# Patient Record
Sex: Male | Born: 1950 | Race: Black or African American | Hispanic: No | Marital: Married | Smoking: Never smoker
Health system: Southern US, Community
[De-identification: ages and names within clinical notes are randomized; demographics above are authoritative.]

## PROBLEM LIST (undated history)

## (undated) DIAGNOSIS — I2699 Other pulmonary embolism without acute cor pulmonale: Secondary | ICD-10-CM

## (undated) DIAGNOSIS — M1990 Unspecified osteoarthritis, unspecified site: Secondary | ICD-10-CM

## (undated) DIAGNOSIS — E785 Hyperlipidemia, unspecified: Secondary | ICD-10-CM

## (undated) HISTORY — DX: Hyperlipidemia, unspecified: E78.5

## (undated) HISTORY — PX: COLONOSCOPY: SHX174

## (undated) HISTORY — DX: Other pulmonary embolism without acute cor pulmonale: I26.99

## (undated) HISTORY — PX: UPPER GASTROINTESTINAL ENDOSCOPY: SHX188

## (undated) HISTORY — PX: ACHILLES TENDON REPAIR: SUR1153

## (undated) HISTORY — DX: Unspecified osteoarthritis, unspecified site: M19.90

---

## 1999-11-22 ENCOUNTER — Ambulatory Visit (HOSPITAL_BASED_OUTPATIENT_CLINIC_OR_DEPARTMENT_OTHER): Admission: RE | Admit: 1999-11-22 | Discharge: 1999-11-22 | Payer: Self-pay | Admitting: Ophthalmology

## 2002-12-27 ENCOUNTER — Ambulatory Visit (HOSPITAL_COMMUNITY): Admission: RE | Admit: 2002-12-27 | Discharge: 2002-12-27 | Payer: Self-pay | Admitting: Internal Medicine

## 2002-12-27 ENCOUNTER — Encounter: Payer: Self-pay | Admitting: Internal Medicine

## 2005-12-15 ENCOUNTER — Ambulatory Visit: Payer: Self-pay | Admitting: Internal Medicine

## 2005-12-16 ENCOUNTER — Ambulatory Visit: Payer: Self-pay | Admitting: Internal Medicine

## 2006-03-17 ENCOUNTER — Ambulatory Visit: Payer: Self-pay | Admitting: Endocrinology

## 2006-12-14 ENCOUNTER — Ambulatory Visit: Payer: Self-pay | Admitting: Internal Medicine

## 2006-12-14 LAB — CONVERTED CEMR LAB
ALT: 16 units/L (ref 0–40)
AST: 30 units/L (ref 0–37)
Albumin: 3.7 g/dL (ref 3.5–5.2)
Alkaline Phosphatase: 60 units/L (ref 39–117)
BUN: 16 mg/dL (ref 6–23)
Bacteria, U Microscopic: NEGATIVE /hpf
Basophils Absolute: 0 10*3/uL (ref 0.0–0.1)
Basophils Relative: 0.4 % (ref 0.0–1.0)
Bilirubin Urine: NEGATIVE
CO2: 30 meq/L (ref 19–32)
Calcium: 9.5 mg/dL (ref 8.4–10.5)
Chloride: 106 meq/L (ref 96–112)
Chol/HDL Ratio, serum: 2.7
Cholesterol: 151 mg/dL (ref 0–200)
Creatinine, Ser: 1.3 mg/dL (ref 0.4–1.5)
Crystals: NEGATIVE
Eosinophil percent: 3.2 % (ref 0.0–5.0)
GFR calc non Af Amer: 61 mL/min
Glomerular Filtration Rate, Af Am: 74 mL/min/{1.73_m2}
Glucose, Bld: 82 mg/dL (ref 70–99)
HCT: 43.1 % (ref 39.0–52.0)
HDL: 55 mg/dL (ref >39.0)
Hemoglobin: 14.4 g/dL (ref 13.0–17.0)
Ketones, ur: NEGATIVE mg/dL
LDL Cholesterol: 87 mg/dL (ref 0–99)
Leukocytes, UA: NEGATIVE
Lymphocytes Relative: 38.9 % (ref 12.0–46.0)
MCHC: 33.4 g/dL (ref 30.0–36.0)
MCV: 91.2 fL (ref 78.0–100.0)
Monocytes Absolute: 0.6 10*3/uL (ref 0.2–0.7)
Monocytes Relative: 10 % (ref 3.0–11.0)
Mucus, UA: NEGATIVE
Neutro Abs: 2.9 10*3/uL (ref 1.4–7.7)
Neutrophils Relative %: 47.5 % (ref 43.0–77.0)
Nitrite: NEGATIVE
PSA: 1.05 ng/mL (ref 0.10–4.00)
Platelets: 264 10*3/uL (ref 150–400)
Potassium: 4.2 meq/L (ref 3.5–5.1)
RBC: 4.72 M/uL (ref 4.22–5.81)
RDW: 13.6 % (ref 11.5–14.6)
Sodium: 140 meq/L (ref 135–145)
Specific Gravity, Urine: 1.025 (ref 1.000–1.03)
TSH: 1.67 microintl units/mL (ref 0.35–5.50)
Total Bilirubin: 0.9 mg/dL (ref 0.3–1.2)
Total Protein, Urine: NEGATIVE mg/dL
Total Protein: 6.9 g/dL (ref 6.0–8.3)
Triglyceride fasting, serum: 43 mg/dL (ref 0–149)
Urine Glucose: NEGATIVE mg/dL
Urobilinogen, UA: 0.2 (ref 0.0–1.0)
VLDL: 9 mg/dL (ref 0–40)
WBC: 6.1 10*3/uL (ref 4.5–10.5)
pH: 6 (ref 5.0–8.0)

## 2006-12-18 ENCOUNTER — Ambulatory Visit: Payer: Self-pay | Admitting: Internal Medicine

## 2007-07-02 DIAGNOSIS — I2699 Other pulmonary embolism without acute cor pulmonale: Secondary | ICD-10-CM

## 2007-07-02 HISTORY — DX: Other pulmonary embolism without acute cor pulmonale: I26.99

## 2007-07-05 ENCOUNTER — Ambulatory Visit: Payer: Self-pay | Admitting: Internal Medicine

## 2007-07-05 ENCOUNTER — Ambulatory Visit: Payer: Self-pay | Admitting: Cardiology

## 2007-07-05 ENCOUNTER — Inpatient Hospital Stay (HOSPITAL_COMMUNITY): Admission: AD | Admit: 2007-07-05 | Discharge: 2007-07-09 | Payer: Self-pay | Admitting: Internal Medicine

## 2007-07-05 ENCOUNTER — Ambulatory Visit: Payer: Self-pay | Admitting: Critical Care Medicine

## 2007-07-06 DIAGNOSIS — I82409 Acute embolism and thrombosis of unspecified deep veins of unspecified lower extremity: Secondary | ICD-10-CM

## 2007-07-06 DIAGNOSIS — I2699 Other pulmonary embolism without acute cor pulmonale: Secondary | ICD-10-CM

## 2007-07-07 ENCOUNTER — Ambulatory Visit: Payer: Self-pay | Admitting: Vascular Surgery

## 2007-07-12 ENCOUNTER — Ambulatory Visit: Payer: Self-pay | Admitting: Internal Medicine

## 2007-07-12 LAB — CONVERTED CEMR LAB
INR: 1.4 (ref 0.9–2.0)
Prothrombin Time: 14.4 s — ABNORMAL HIGH (ref 10.0–14.0)

## 2007-07-15 ENCOUNTER — Ambulatory Visit: Payer: Self-pay | Admitting: Cardiovascular Disease

## 2007-07-15 LAB — CONVERTED CEMR LAB
INR: 2.2 — ABNORMAL HIGH (ref 0.9–2.0)
Prothrombin Time: 18.4 s — ABNORMAL HIGH (ref 10.0–14.0)

## 2007-07-19 ENCOUNTER — Ambulatory Visit: Payer: Self-pay | Admitting: Internal Medicine

## 2007-07-19 ENCOUNTER — Ambulatory Visit: Payer: Self-pay | Admitting: Cardiology

## 2007-07-29 ENCOUNTER — Ambulatory Visit: Payer: Self-pay | Admitting: Cardiology

## 2007-08-12 ENCOUNTER — Ambulatory Visit: Payer: Self-pay | Admitting: Cardiology

## 2007-09-06 ENCOUNTER — Ambulatory Visit: Payer: Self-pay | Admitting: Critical Care Medicine

## 2007-09-09 ENCOUNTER — Ambulatory Visit: Payer: Self-pay | Admitting: Cardiology

## 2007-09-30 ENCOUNTER — Ambulatory Visit: Payer: Self-pay | Admitting: Internal Medicine

## 2007-11-02 ENCOUNTER — Telehealth: Payer: Self-pay | Admitting: Internal Medicine

## 2007-11-09 ENCOUNTER — Ambulatory Visit: Payer: Self-pay | Admitting: Cardiology

## 2007-11-22 ENCOUNTER — Ambulatory Visit: Payer: Self-pay | Admitting: Cardiology

## 2007-12-10 ENCOUNTER — Ambulatory Visit: Payer: Self-pay | Admitting: Cardiology

## 2008-10-24 IMAGING — CT CT ANGIO CHEST
4 of 7 series · 12 of 30 positions shown · IV contrast (100 ML OMNI 300)
Comparison: None

CLINICAL DATA: Shortness of breath on exertion for 1-1/2 weeks. Hypertension.

CT ANGIOGRAPHY OF CHEST - PULMONARY EMBOLISM PROTOCOL
TECHNIQUE: Multidetector CT imaging of the chest was performed according to the
protocol for detection of pulmonary embolism during bolus injection of
intravenous contrast.  Coronal and sagittal plane CT angiographic image
reconstructions were also generated.
Contrast:  100 cc Omnipaque 300

[Series 3: pe · axial · 0.66mm/px · z∈[-245,-68]mm · 5 of 235 slices shown]
[im 47/235  lung]
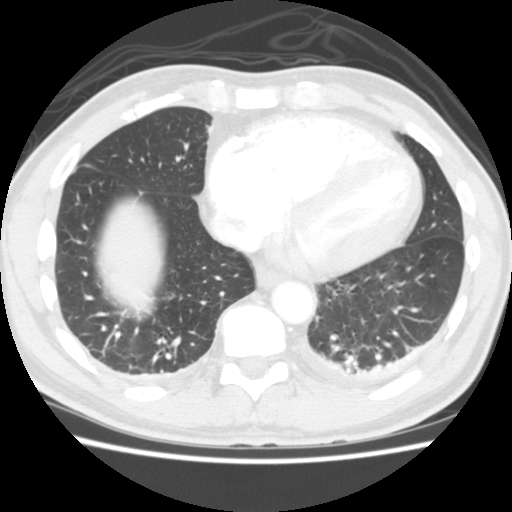
[im 94/235  mediastinal]
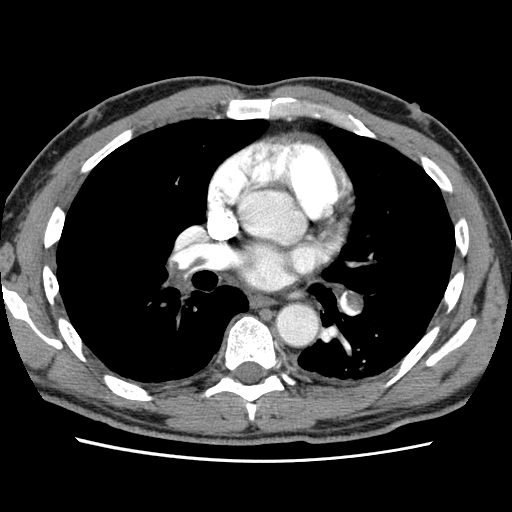
[im 118/235  lung]
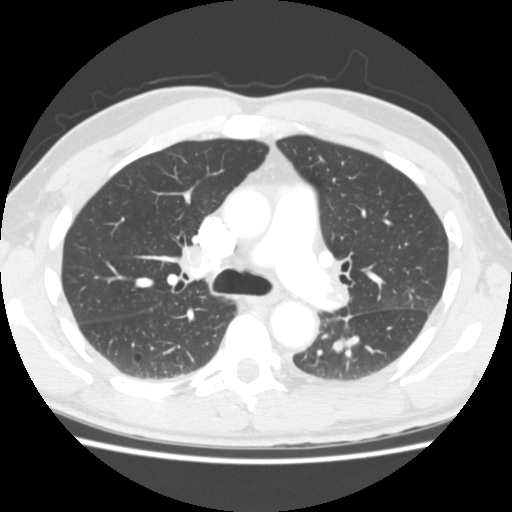
[im 141/235  mediastinal]
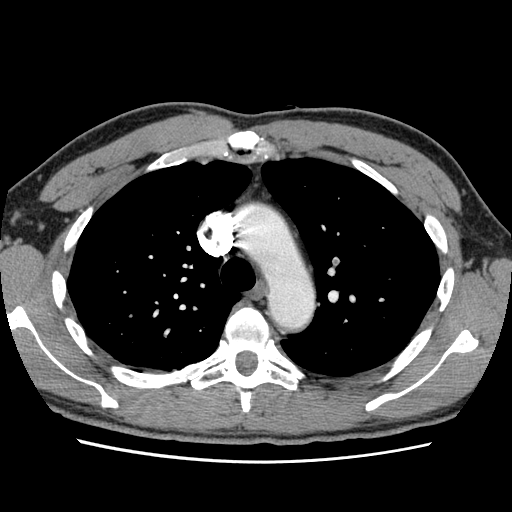
[im 188/235  lung]
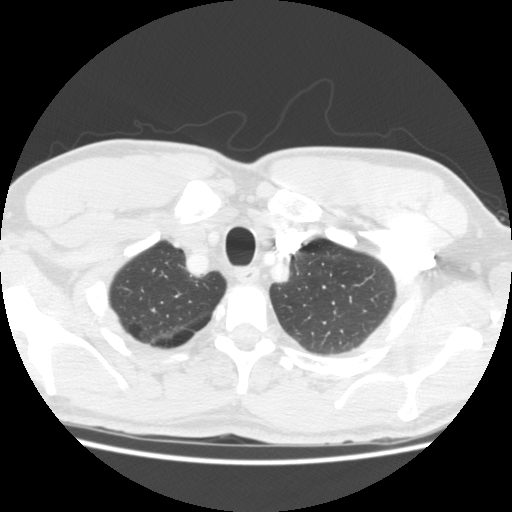

[Series 4: recon 2: pe · axial · 0.66mm/px · z∈[-157,-155]mm · 2 of 118 slices shown]
[im 59/118  lung]
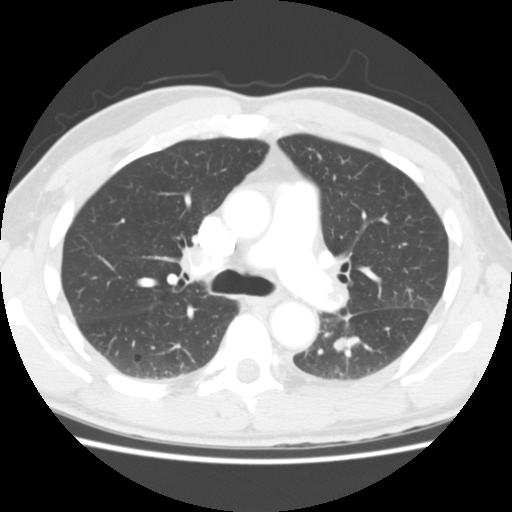
[im 60/118  lung]
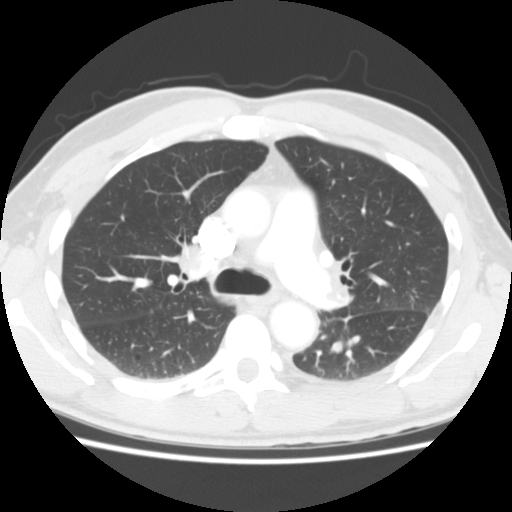

[Series 300: sag pe · sagittal · 0.64mm/px · 3 of 145 slices shown]
[im 37/145  lung]
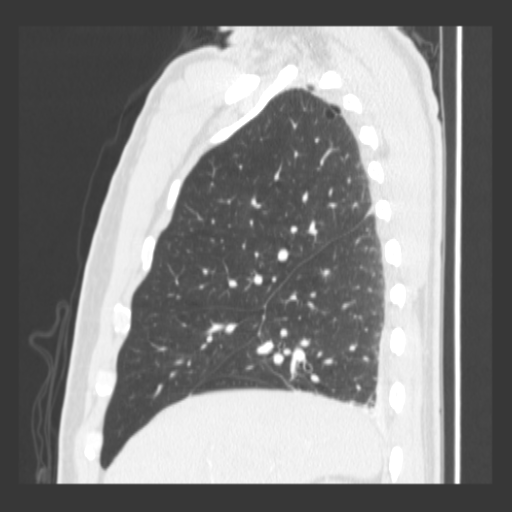
[im 73/145  lung]
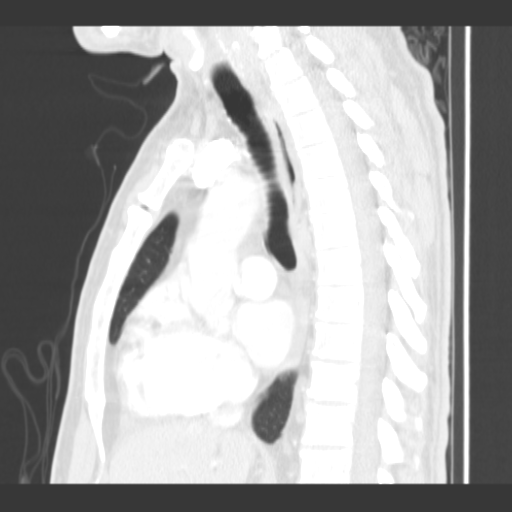
[im 109/145  lung]
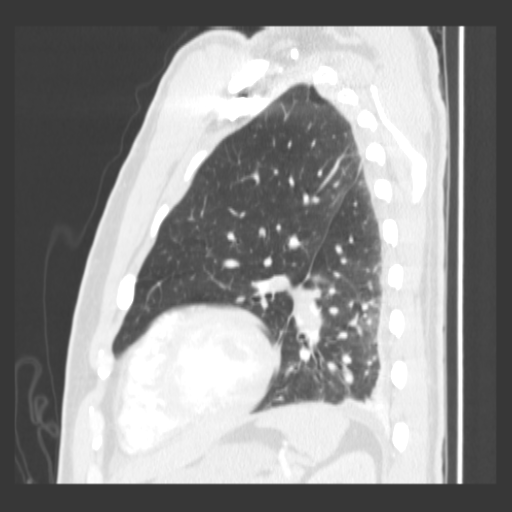

[Series 301: cor pe · coronal · 0.64mm/px · 2 of 117 slices shown]
[im 39/117  lung]
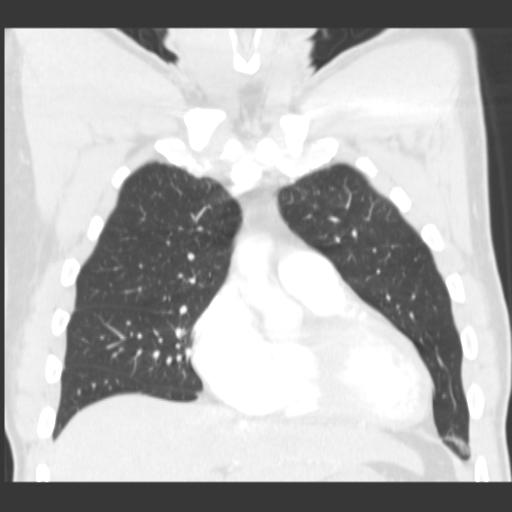
[im 78/117  lung]
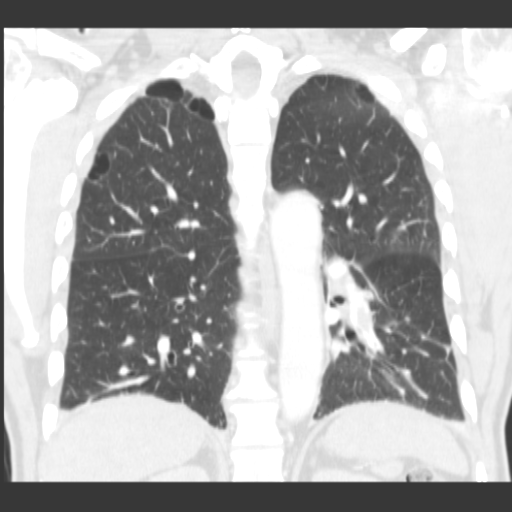

[12 of 30 positions shown; findings below may reference images not displayed]

FINDINGS: Lung windows demonstrate paraseptal emphysema. Bibasilar atelectasis.

Soft tissue windows:  The quality of this exam for evaluation of pulmonary
embolism is sufficient. Extensive bilateral pulmonary emboli with extension into
all lobar pulmonary artery branches.

There is flattening of the interventricular septum suspicious for mild right
heart strain. The pulmonary outflow tract measures within normal limits.

Heart is mildly enlarged. Trace bilateral pleural effusions.

Normal aortic caliber without evidence of a dissection. No mediastinal or hilar
adenopathy.

Limited abdominal imaging is unremarkable.

Bone windows demonstrate no worrisome osseous lesion. .

IMPRESSION

1. Extensive bilateral pulmonary embolism with findings suspicious for right
heart strain.  I called this report to, the patient's nurse, Rantona, at [DATE] a.m.
on 07/07/2007. 

2. Mild cardiomegaly. Trace bilateral pleural effusions.

## 2011-04-15 NOTE — Cardiovascular Report (Signed)
NAME:  ORIEL, RUMBOLD NO.:  [id number]   MEDICAL RECORD NO.:  [id number]          PATIENT TYPE:  INP   LOCATION:  4705                         FACILITY:  [facility]   PHYSICIAN:  Veverly Fells. Excell Seltzer, MD  DATE OF BIRTH:  1951-11-25   DATE OF PROCEDURE:  07/06/2007  DATE OF DISCHARGE:                            CARDIAC CATHETERIZATION   PROCEDURE:  1. Left heart catheterization.  2. Selective coronary angiography.  3. Left ventricular angiography.  4. StarClose of the right femoral artery.   INDICATIONS:  Jorge Rogers is a 60 year old gentleman who presented with  chest pain.  He ruled in for a small subendocardial MI.  In the setting  of his chest pain with positive cardiac enzymes, he was referred for  cardiac catheterization.  He also had shortness of breath at  presentation and a positive D-dimer, and plans are underway to evaluate  his coronary status with a left heart catheterization and, if that is  unremarkable, to proceed with further testing for pulmonary embolus.   Risks and indications of procedure were reviewed with the patient.  Informed consent was obtained.  The right groin was prepped, draped and  anesthetized with 1% lidocaine. Using modified the Seldinger technique,  a 6-French sheath was placed in the right femoral artery.  Multiple  views of the right and left coronary arteries were taken with standard  preformed Judkins catheters.  Following selective coronary angiography,  an angled pigtail was inserted into the left ventricle, and pressures  were recorded.  Left ventriculogram was performed. A pullback across the  aortic valve was done. At the completion of the procedure, a StarClose  device was used to seal the arteriotomy.  There were no immediate  complications.   FINDINGS:  Aortic pressure 89/64 with a mean of 77, left ventricular  pressure 89/6.   The left mainstem is angiographically normal.  It bifurcates into the  LAD and left  circumflex   The LAD is a large-caliber vessel that courses down and wraps around the  LV apex. The ostium of the LAD has minor tapering which may be a  nonobstructive plaque or also could represent a normal anatomic variant.  It  appears no worse than a 20% stenosis.  The remaining portions of the  proximal, mid, and distal LAD are free of any significant angiographic  disease.  There are three diagonal branches, all of which are relatively  small.   Left circumflex is a large-caliber vessel.  It courses down and supplies  a large first OM branch that gives off multiple OM branch vessels.  The  AV groove circumflex is medium size and courses down to give a left  posterolateral branch.  There is no significant angiographic disease in  the left circumflex.   The right coronary artery is dominant.  There is a PDA branch as well as  a posterior AV segment that supplies two small posterolaterals. There is  a large acute marginal branch.  There is no significant angiographic  disease in the right coronary artery.   Left ventricular function is low-normal.  LVEF is estimated 50-55%.  There is no mitral regurgitation.   ASSESSMENT:  1. Normal right coronary artery.  2. Normal left circumflex.  3. Minor nonobstructive left anterior descending stenosis.  4. Normal left ventricular function.   DISCUSSION:  Mr. Wernick likely has noncardiac chest pain.   PLAN:  As noted, rule out pulmonary embolus, and I agree with this plan  in a patient with chest pain and positive D-dimer. No other cardiac  workup is necessary.      Veverly Fells. Excell Seltzer, MD  Electronically Signed     MDC/MEDQ  D:  07/06/2007  T:  07/06/2007  Job:  [id number]   cc:   Georgina Quint. Plotnikov, MD

## 2011-04-15 NOTE — H&P (Signed)
NAME:  Jorge Rogers, Jorge Rogers NO.:  [id number]   MEDICAL RECORD NO.:  [id number]          PATIENT TYPE:  INP   LOCATION:  4705                         FACILITY:  [facility]   PHYSICIAN:  Georgina Quint. Plotnikov, MDDATE OF BIRTH:  [birth year]   DATE OF ADMISSION:  07/05/2007  DATE OF DISCHARGE:                              HISTORY & PHYSICAL   CHIEF COMPLAINT:  Shortness of breath, chest tightness.   HISTORY OF PRESENT ILLNESS:  The patient is a 60 year old male, an avid  athlete, who was playing tennis on Saturday a week ago when he noticed  that he has chest tightness and shortness of breath.  He was sweating a  lot.  He was able to finish the game but was very tired.  He has played  probably 2 or 3 times since then and every time felt short of breath,  was sweating profusely and felt tired.  He also played golf and again  was quite short of breath.  There was some tightness the back of his  legs.  Right now he thinks he might be having some tightness in the  chest; however, it is extremely mild.  On average, when he is not  playing sports he has remained symptom-free.  No previous exertional  problems.  No recent travel or periods of immobility.   FAMILY HISTORY:  Mother is in her 16s.  She is alive.  Father died in  his 7s with probably coronary disease and heart attack.   He does not smoke, does not drink alcohol.  He placed golf and tennis  quite a bit.   SOCIAL HISTORY:  He is married.  He does not smoke or drink alcohol.  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   PAST MEDICAL HISTORY:  GERD.  No history of coronary disease or high  blood pressure.   REVIEW OF SYSTEMS:  As above.  No indigestion, no blood in the stool.  The rest of the 18-point review of systems is negative.   PHYSICAL:  Blood pressure 109/73, pulse 78, temperature 99, weight 200  pounds.  Looks well, in no acute distress.  HEENT: Moist mucosa.  NECK:  Supple.  No thyromegaly or bruit.  LUNGS:  Clear.  No wheezes or rales.  HEART:  S1-S2, no murmur, no gallop.  ABDOMEN:  Soft, nontender, no organomegaly, no mass felt.  LOWER EXTREMITIES:  Without edema.  Calves nontender.  No swelling.  Good peripheral pulses.  He is alert, oriented and cooperative.  Denies being depressed.  ABDOMEN:  Without pulsating masses.   EKG with ST changes, new from the previous.  O2 saturation 98% on room  air.   ASSESSMENT/PLAN:  1. Exertional chest pain of 1+ week duration.  Dyspnea on exertion.      In view of his new EKG changes, will admit to [facility],      obtain cardiology consult, obtain CT of the chest to rule out      pulmonary embolus protocol, obtain fibrinogen and D-dimer, start on      aspirin, nitroglycerin,  Lovenox if CT is negative for aortic      aneurysm, rule out MI protocol with EKG in the morning, CK,      troponin q.8h.  2. Family history of coronary disease.  Plan as above.  3. Gastroesophageal reflux disease.  He is on omeprazole 20 mg daily,      well-controlled.      Georgina Quint. Plotnikov, MD  Electronically Signed     AVP/MEDQ  D:  07/05/2007  T:  07/06/2007  Job:  [id number]

## 2011-04-15 NOTE — Consult Note (Signed)
NAME:  Jorge Rogers, Jorge Rogers NO.:  [id number]   MEDICAL RECORD NO.:  [id number]          PATIENT TYPE:  INP   LOCATION:  4705                         FACILITY:  [facility]   PHYSICIAN:  Charlcie Cradle. Delford Field, MD, FCCPDATE OF BIRTH:  08-Jan-1951   DATE OF CONSULTATION:  DATE OF DISCHARGE:                                 CONSULTATION   PULMONARY CONSULTATION:   CHIEF COMPLAINT:  Pulmonary embolism.   HISTORY OF PRESENT ILLNESS:  This 60 year old male who while playing  tennis developed increased chest tightness and shortness of breath a  week ago.  Prior to this, he had noticed some calf pain on the right.  He has played it several times since that time and felt more short of  breath and had profuse diaphoresis.  He also played golf and was short  of breath with this as well, had some fatigue in the back of his legs.  He was admitted on July 05, 2007.  Initially had positive D-dimer and  low-grade positive enzymes.  It was felt that he might have a  subendocardial MI.  He was given IV heparin, taken to cath lab on July 06, 2007, and it showed no coronary artery disease.  CAT scan of the  chest today showed bilateral pulmonary emboli.  He is referred for  further evaluation.   PAST MEDICAL HISTORY:  1. Reflux disease.  2. No previous history of coronary disease or hypertension or the risk      factors.   SOCIAL HISTORY:  He is married.  Does not smoke or drink.  Works as an  Land.   FAMILY HISTORY:  Mother is alive in her 36s.  Father died in his 28s of  coronary artery disease.   MEDICATIONS PRIOR TO ADMISSION:  None.   PHYSICAL EXAM:  VITAL SIGNS:  Temperature 98, blood pressure 101/70,  pulse 91, respirations 16, saturation 98% on room air.  CHEST:  Showed to be clear without evidence of wheeze or rhonchi.  CARDIAC EXAM:  Showed a regular rate and rhythm without S3.  Normal S1,  S2.  ABDOMEN:  Soft, nontender.  EXTREMITIES:  Showed no  edema or clubbing.  SKIN:  Clear.   CT scan of the chest was obtained and reviewed and shows bilateral  pulmonary emboli.  Deep venous studies have not been done of the lower  extremities as of yet.  D-dimer was 8.75, fibrinogen 499.  Creatinine  1.2.  Hemoglobin 12.7, white count 7.6.  Sodium 138, potassium 3,  chloride 106, BUN 14, Creatinine 1.4.   IMPRESSION:  Bilateral pulmonary emboli but no indication for or need  for vena cava filter or lytic therapy.  The patient is hemodynamically  stable.   RECOMMENDATIONS:  Obtain TSH, CA-125, hypercoagulable panel, and will be  given consideration for the __________ trial and also obtain venous  Dopplers of lower extremities.      Charlcie Cradle Delford Field, MD, [facility]  Electronically Signed     PEW/MEDQ  D:  07/07/2007  T:  07/08/2007  Job:  [id number]  cc:   Raenette Rover. Felicity Coyer, MD  Georgina Quint Plotnikov, MD

## 2011-04-15 NOTE — Discharge Summary (Signed)
NAME:  Jorge Rogers, Jorge Rogers NO.:  [id number]   MEDICAL RECORD NO.:  [id number]          PATIENT TYPE:  INP   LOCATION:  4705                         FACILITY:  [facility]   PHYSICIAN:  Jorge Rogers, MDDATE OF BIRTH:  Oct 03, 1951   DATE OF ADMISSION:  07/05/2007  DATE OF DISCHARGE:  07/09/2007                               DISCHARGE SUMMARY   DISCHARGE DIAGNOSES:  1. Bilateral pulmonary embolus.  2. Right lower extremity deep venous thrombosis.  3. Gastroesophageal reflux disease.  4. Chest pain secondary to bilateral pulmonary embolus, status post      cardiac catheterization performed July 06, 2007, which was      negative.   HISTORY OF PRESENT ILLNESS:  Mr. Jorge Rogers is a 60 year old male who was  admitted on July 05, 2007, with chief complaint of shortness of breath  and chest tightness.  He noted that he had been sweating a lot  frequently and had increased fatigue while playing tennis.  This was  accompanied by some tightness in the back of his legs.  He was admitted  for further evaluation and treatment.   PAST MEDICAL HISTORY:  1. GERD.  2. No history of coronary artery disease or high blood pressure.   HOSPITAL COURSE:  The patient was admitted and initially underwent  serial cardiac enzymes which noted a mild elevation of troponin with a  value of 0.08.  D-dimer was elevated on admission with value of 8.75.  He was anticoagulated with IV heparin and a cardiology consult was  requested.  The patient underwent a cardiac catheterization performed by  Dr. Tonny Rogers on July 06, 2007.  This showed normal coronary  arteries with a normal LV function.  It was felt that his chest pain was  noncardiac.  This was followed by CT angiography of the chest which was  positive for bilateral PE.  He was continued on IV heparin and then  transitioned over to full-dose Lovenox.  He was started on Coumadin.  At  this time, the patient is clinically stable.  He was seen  in  consultation by Dr. Shan Rogers of pulmonary during this admission  and has been enrolled in the [facility] trial.  He has been randomized to  Lovenox and Coumadin and plans at this time are for the patient to  follow up in the Coumadin clinic on Monday morning, July 12, 2007,  with full-dose Lovenox.  Lovenox has been obtained with the assistance  of the reimbursement and patient assistance program as the patient is  currently on Ensure.  Room air saturations with ambulation have been  stable.   The patient also underwent lower extremity Dopplers bilaterally which  noted a right-sided DVT.   DISCHARGE MEDICATIONS:  1. Lovenox 90 mg subcutaneous twice daily until further instructions      per Coumadin clinic.  The patient is being provided with 10 doses      from the patient assistance program.  2. Coumadin 5 mg p.o. daily in the evening to be adjusted per Coumadin      clinic.  DISPOSITION:  Plan to discharge the patient to home.   FOLLOW UP:  The patient is instructed to follow up with Dr. Macarthur Critchley  Rogers in 1 week.  In addition, he is to follow up in the Coumadin  Clinic on Monday morning, July 12, 2007, at 9 a.m. for followup  PT/INR.      Jorge Craze, NP      Jorge Quint. Plotnikov, MD  Electronically Signed    MO/MEDQ  D:  07/09/2007  T:  07/09/2007  Job:  [id number]

## 2011-04-15 NOTE — Consult Note (Signed)
NAME:  WAYMOND, MEADOR NO.:  [id number]   MEDICAL RECORD NO.:  [id number]          PATIENT TYPE:  INP   LOCATION:  4705                         FACILITY:  [facility]   PHYSICIAN:  Thomas C. Wall, MD, FACCDATE OF BIRTH:  February 26, 1951   DATE OF CONSULTATION:  07/05/2007  DATE OF DISCHARGE:                                 CONSULTATION   PRIMARY CARE PHYSICIAN:  Dr. Posey Rea   PRIMARY CARDIOLOGIST:  New - will be Dr. Daleen Squibb.   CHIEF COMPLAINT:  Chest pain/abnormal ECG.   HISTORY OF PRESENT ILLNESS:  Mr. Gueye is a 60 year old African American  male with no history of coronary artery disease.  He was playing tennis  on Saturday, June 26, 2007, when he had sudden onset of shortness of  breath that was much worse than he expected it to be with his exertion  level.  The temperature was greater than 90 degrees and he had already  warmed up and was at maximum exertion.  The shortness of breath was  associated with chest tightness and reached a 5/10 or 6/10.  He had a  practice session on Sunday but did not was not able to exert himself as  much as usual felt like he was about 80% of normal.  The following  Thursday he had another tennis match and with less exertion had chest  pain at 7/10 that was associated with shortness of breath as well as  diaphoresis.  Since then, he has been unable to exercise much.  This  past Saturday he was in a golf tournament and had chest pain with  ambulating about 20 feet.  Since then he has not been very active and in  fact had limited his activities because of the fatigue he felt and the  fact that activity would give him chest pain.  Today he saw his primary  care physician and was admitted because his EKG was markedly abnormal.  In the hospital he has developed chest pain at 2/10 which was relieved  with one sublingual nitroglycerin.  He is now pain free.   PAST MEDICAL HISTORY:  He has a history of gastroesophageal reflux  disease.  He denies  any history of diabetes, hypertension or  hyperlipidemia.  He gets a regular checkup and gets his cholesterol  checked on a yearly basis.  There is no documented family history of  premature coronary artery disease.   SURGICAL HISTORY:  He had surgery on his elbow as a teenager.   ALLERGIES:  No known drug allergies.   MEDICATIONS PRIOR TO ADMISSION:  Occasional Prilosec.   SOCIAL HISTORY:  He lives in [place] with his wife and previously  worked in Equities trader.  He has no history of alcohol, tobacco or  drug abuse.   FAMILY HISTORY:  His mother is 56 years old and has no history of  coronary artery disease.  His father died in his 25s.  He was found on  the side of the road beside his car with the hood up.  There was no  autopsy.  He had  never been evaluated for chest pain and never  complained of chest pain.  He has one sister with a murmur but no  siblings with coronary artery disease and he cannot remember any family  members with a history of coronary artery disease at a young age.   REVIEW OF SYSTEMS:  The chest pain and shortness of breath are described  above.  Yesterday he had a slight cough.  He has no significant  arthralgias.  He rarely has reflux symptoms or indigestion.  He has had  no fevers, chills or sweats.  Since his chest pain began he has had  dyspnea on exertion but not before then.  Review of systems is otherwise  negative.   PHYSICAL EXAMINATION:  VITAL SIGNS:  He is afebrile.  Blood pressure  118/62 with a heart rate of 62, respiratory rate 20.  GENERAL:  He is a well-developed [place] American male in no acute  distress.  HEENT:  Normal.  NECK:  There is no lymphadenopathy, thyromegaly, bruit or JVD noted.  CVA:  His heart is regular in rate and rhythm with an S1 and S2 and no  significant murmur, rub or gallop is noted.  Distal pulses are intact in  all four extremities and no femoral bruits are appreciated.  LUNGS:  Clear to auscultation  bilaterally.  SKIN:  No rashes or lesions are noted.  ABDOMEN:  Soft and nontender with active bowel sounds and no  hepatosplenomegaly by palpation.  EXTREMITIES:  There is no cyanosis, clubbing or edema noted.  MUSCULOSKELETAL:  There is no joint deformity or effusions and no spine  or CVA tenderness.  NEUROLOGIC:  He is alert and oriented.  Cranial nerves II-XII grossly  intact.   Chest x-ray and laboratory values are pending.   EKG:  Sinus rhythm, rate 75 with T-wave inversions in V2 through V4 as  well as minor T-wave inversions in the inferior leads which is different  from an EKG dated January 2008.   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  His  only risk factors are possible family history, gender and 60.  He is  now pain-free after sublingual nitroglycerin.  He will be continued on  IV nitroglycerin and we will make sure he is on aspirin and heparin.  Cardiac catheterization will be performed in a.m., urgently if he  develops pain that is unrelieved with medical therapy.  Integrilin is  considered, but as long as he is pain-free will not use it right now.  The indications of the cardiac catheterization as well as the risks and  benefits were discussed with the patient and he agrees to proceed.      Theodore Demark, PA-C      Jesse Sans. Daleen Squibb, MD, [facility]  Electronically Signed    RB/MEDQ  D:  07/05/2007  T:  07/06/2007  Job:  [id number]   cc:   Georgina Quint. Plotnikov, MD

## 2011-04-15 NOTE — Assessment & Plan Note (Signed)
[facility]                             PULMONARY OFFICE NOTE   CHRISTINE, SCHIEFELBEIN                         MRN:          [id number]  DATE:09/06/2007                            DOB:          05-Apr-1951    Mr. Stoudt is seen today in return followup.  History of pulmonary  embolism.  The patient is on Coumadin therapy.  He is planned for  treatment of 6 months.  He is having no chest pain or difficulty with  breathing.  He maintains the Prilosec over-the-counter daily, Coumadin  daily.   On exam, temperature 98, blood pressure 118/86, pulse 72, saturation 99%  in room air.  Chest showed to be clear without evidence of wheeze, rale  or rhonchi.  Cardiac exam showed a regular rate and rhythm without S3;  normal S1, S2.  Abdomen was soft, nontender.  Extremities showed no  edema or clubbing.  Skin was clear.   Impression on this patient is that of pulmonary embolism with positive  response to Coumadin therapy.  Plan is to maintain Coumadin for a 6-  month interval of time and then will see this patient back in return  followup in 3 months.  The start date of his treatment was July 02, 2007.  Therefore, he will complete therapy in February 2009.     Charlcie Cradle Delford Field, MD, [facility]  Electronically Signed    PEW/MedQ  DD: 09/06/2007  DT: 09/06/2007  Job #: [id number]   cc:   Georgina Quint. Plotnikov, MD

## 2011-04-18 NOTE — Assessment & Plan Note (Signed)
[facility]                           PRIMARY CARE OFFICE NOTE   Jorge Rogers, Jorge Rogers                         MRN:          [id number]  DATE:12/18/2006                            DOB:          [birth year]    The patient is a 60 year old male who presents for a wellness  examination.   PAST MEDICAL HISTORY/FAMILY HISTORY/SOCIAL HISTORY:  As per December 16, 2005.  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   ALLERGIES:  None.   CURRENT MEDICATIONS:  1. A baby aspirin a day.  2. Prilosec 20 mg a day p.r.n.   REVIEW OF SYSTEMS:  Negative for chest pain or shortness of breath.  He  continues to be very active, playing tennis.  No joint problems since  last year.  He had 2 to 3 episodes when he would get sick for 2 days  with joint stiffness, fever in the joints, that would last for a couple  of days and then resolve spontaneously, maybe related to long driving.  No other symptoms.  The rest of the 18-point system review is negative.   PHYSICIANS:  Blood pressure 102/65, pulse 62, temperature 97.3, weight  199 pounds.  He looks well in no acute distress.  HEENT:  Moist mucosa.  NECK:  Supple.  No thyromegaly or bruits.  LUNGS:  Clear to auscultation and percussion.  No wheeze or rales.  CARDIAC:  S1, S2.  No murmur.  No gallop.  ABDOMEN:  Soft and nontender.  No organomegaly or masses felt.  LOWER EXTREMITIES:  Without edema.  He is alert and appropriate, denies being depressed.  No lymphadenopathy.  Skin and conjunctivae without rashes.  Normal external genitalia.  Prostate normal size.  No nodules.  No masses.  No rectal masses.  Stool  guaiac negative.   LABS:  On December 14, 2006.  CBC normal.  Cholesterol 151.  LDL normal.  CMET normal.  PSA normal.  TSH normal.  Urinalysis with 3-5 RBCs.  EKG  today was normal sinus rhythm.   ASSESSMENT AND PLAN:  1. Normal wellness examination.  Age/health issues discussed.  Healthy      life-style  discussed.  His last chest x-ray was done in January of      2007.  I will obtain another 1.  He will be due for colonoscopy in      2009.  Repeat exam in 12 months.  2. Microhematuria, chronic, status post urologic evaluation.  3. Episodic arthralgias with fever during the past year, as above.      Etiology is unclear.  If      recurs, he will try to make it to the lab here for blood culture,      CBC, and sed rate, and      urinalysis.  I wonder if it would be related to a case of recurrent      prostatitis.  After he did the tests, he would take Doxycycline 100      p.o. b.i.d. for 10 days.     Georgina Quint. Plotnikov,  MD  Electronically Signed    AVP/MedQ  DD: 12/20/2006  DT: 12/20/2006  Job #: [id number]

## 2011-08-07 ENCOUNTER — Other Ambulatory Visit: Payer: Self-pay | Admitting: *Deleted

## 2011-08-07 DIAGNOSIS — Z0000 Encounter for general adult medical examination without abnormal findings: Secondary | ICD-10-CM

## 2011-08-07 DIAGNOSIS — Z0389 Encounter for observation for other suspected diseases and conditions ruled out: Secondary | ICD-10-CM

## 2011-08-08 ENCOUNTER — Other Ambulatory Visit: Payer: Self-pay

## 2011-08-12 ENCOUNTER — Encounter: Payer: Self-pay | Admitting: *Deleted

## 2011-08-12 ENCOUNTER — Encounter: Payer: Self-pay | Admitting: Internal Medicine

## 2011-08-13 ENCOUNTER — Other Ambulatory Visit (INDEPENDENT_AMBULATORY_CARE_PROVIDER_SITE_OTHER): Payer: Self-pay

## 2011-08-13 DIAGNOSIS — Z0000 Encounter for general adult medical examination without abnormal findings: Secondary | ICD-10-CM

## 2011-08-13 DIAGNOSIS — Z0389 Encounter for observation for other suspected diseases and conditions ruled out: Secondary | ICD-10-CM

## 2011-08-13 LAB — CBC WITH DIFFERENTIAL/PLATELET
Basophils Relative: 0.4 % (ref 0.0–3.0)
Eosinophils Relative: 1.6 % (ref 0.0–5.0)
HCT: 43.8 % (ref 39.0–52.0)
Hemoglobin: 14.6 g/dL (ref 13.0–17.0)
Lymphs Abs: 2.6 10*3/uL (ref 0.7–4.0)
MCV: 93.7 fl (ref 78.0–100.0)
Monocytes Relative: 7.7 % (ref 3.0–12.0)
Neutro Abs: 4.9 10*3/uL (ref 1.4–7.7)
WBC: 8.3 10*3/uL (ref 4.5–10.5)

## 2011-08-13 LAB — LIPID PANEL
Cholesterol: 168 mg/dL (ref 0–200)
HDL: 49.9 mg/dL (ref >39.00)
LDL Cholesterol: 97 mg/dL (ref 0–99)
VLDL: 21.6 mg/dL (ref 0.0–40.0)

## 2011-08-13 LAB — URINALYSIS, ROUTINE W REFLEX MICROSCOPIC
Ketones, ur: NEGATIVE
Leukocytes, UA: NEGATIVE
Urine Glucose: NEGATIVE
Urobilinogen, UA: 0.2 (ref 0.0–1.0)

## 2011-08-13 LAB — BASIC METABOLIC PANEL
BUN: 13 mg/dL (ref 6–23)
Calcium: 9.6 mg/dL (ref 8.4–10.5)
GFR: 80.18 mL/min (ref >60.00)
Glucose, Bld: 76 mg/dL (ref 70–99)
Sodium: 142 mEq/L (ref 135–145)

## 2011-08-13 LAB — PSA: PSA: 1.08 ng/mL (ref 0.10–4.00)

## 2011-08-13 LAB — HEPATIC FUNCTION PANEL
Albumin: 4.1 g/dL (ref 3.5–5.2)
Total Bilirubin: 0.8 mg/dL (ref 0.3–1.2)

## 2011-08-13 LAB — TSH: TSH: 1.26 u[IU]/mL (ref 0.35–5.50)

## 2011-08-15 ENCOUNTER — Ambulatory Visit (INDEPENDENT_AMBULATORY_CARE_PROVIDER_SITE_OTHER): Payer: PRIVATE HEALTH INSURANCE | Admitting: Internal Medicine

## 2011-08-15 ENCOUNTER — Encounter: Payer: Self-pay | Admitting: Internal Medicine

## 2011-08-15 VITALS — BP 110/78 | HR 68 | Temp 98.2°F | Resp 16 | Ht 71.5 in | Wt 200.0 lb

## 2011-08-15 DIAGNOSIS — Z136 Encounter for screening for cardiovascular disorders: Secondary | ICD-10-CM

## 2011-08-15 DIAGNOSIS — I2699 Other pulmonary embolism without acute cor pulmonale: Secondary | ICD-10-CM

## 2011-08-15 DIAGNOSIS — Z0000 Encounter for general adult medical examination without abnormal findings: Secondary | ICD-10-CM

## 2011-08-15 DIAGNOSIS — R3129 Other microscopic hematuria: Secondary | ICD-10-CM

## 2011-08-15 MED ORDER — CHOLECALCIFEROL 25 MCG (1000 UT) PO TABS: 1000.0000 [IU] | ORAL_TABLET | Freq: Every day | ORAL | Status: DC

## 2011-08-15 NOTE — Assessment & Plan Note (Signed)
Urol cons

## 2011-08-15 NOTE — Progress Notes (Signed)
  Subjective:    Patient ID: Jorge Rogers, male    DOB: [birth year], 60 y.o.   MRN: [id number]  HPI  The patient is here for a wellness exam. New pt - not seen in 3 years. The patient has been doing well overall without major physical or psychological issues going on lately.Review of Systems  Constitutional: Negative for appetite change, fatigue and unexpected weight change.  HENT: Negative for nosebleeds, congestion, sore throat, sneezing, trouble swallowing and neck pain.   Eyes: Negative for itching and visual disturbance.  Respiratory: Negative for cough.   Cardiovascular: Negative for chest pain, palpitations and leg swelling.  Gastrointestinal: Negative for nausea, diarrhea, blood in stool and abdominal distention.  Genitourinary: Negative for frequency and hematuria.  Musculoskeletal: Negative for back pain, joint swelling and gait problem.  Skin: Negative for rash.  Neurological: Negative for dizziness, tremors, speech difficulty and weakness.  Psychiatric/Behavioral: Negative for sleep disturbance, dysphoric mood and agitation. The patient is not nervous/anxious.        Objective:   Physical Exam  Constitutional: He is oriented to person, place, and time. He appears well-developed and well-nourished. No distress.  HENT:  Head: Normocephalic and atraumatic.  Right Ear: External ear normal.  Left Ear: External ear normal.  Nose: Nose normal.  Mouth/Throat: Oropharynx is clear and moist. No oropharyngeal exudate.  Eyes: Conjunctivae and EOM are normal. Pupils are equal, round, and reactive to light. Right eye exhibits no discharge. Left eye exhibits no discharge. No scleral icterus.  Neck: Normal range of motion. Neck supple. No JVD present. No tracheal deviation present. No thyromegaly present.  Cardiovascular: Normal rate, regular rhythm, normal heart sounds and intact distal pulses.  Exam reveals no gallop and no friction rub.   No murmur heard. Pulmonary/Chest: Effort normal  and breath sounds normal. No stridor. No respiratory distress. He has no wheezes. He has no rales. He exhibits no tenderness.  Abdominal: Soft. Bowel sounds are normal. He exhibits no distension and no mass. There is no tenderness. There is no rebound and no guarding.  Genitourinary: Rectum normal, prostate normal and penis normal. Guaiac negative stool. No penile tenderness.  Musculoskeletal: Normal range of motion. He exhibits no edema and no tenderness.  Lymphadenopathy:    He has no cervical adenopathy.  Neurological: He is alert and oriented to person, place, and time. He has normal reflexes. No cranial nerve deficit. He exhibits normal muscle tone. Coordination normal.  Skin: Skin is warm and dry. No rash noted. He is not diaphoretic. No erythema. No pallor.  Psychiatric: He has a normal mood and affect. His behavior is normal. Judgment and thought content normal.   Lab Results  Component Value Date   WBC 8.3 08/13/2011   HGB 14.6 08/13/2011   HCT 43.8 08/13/2011   PLT 247.0 08/13/2011   CHOL 168 08/13/2011   TRIG 108.0 08/13/2011   HDL 49.90 08/13/2011   ALT 15 08/13/2011   AST 23 08/13/2011   NA 142 08/13/2011   K 3.9 08/13/2011   CL 104 08/13/2011   CREATININE 1.2 08/13/2011   BUN 13 08/13/2011   CO2 24 08/13/2011   TSH 1.26 08/13/2011   PSA 1.08 08/13/2011   INR 2.2 RATIO* 07/15/2007          Assessment & Plan:

## 2011-08-15 NOTE — Assessment & Plan Note (Signed)
We discussed age appropriate health related issues, including available/recomended screening tests and vaccinations. We discussed a need for adhering to healthy diet and exercise. Labs/EKG were reviewed/ordered. All questions were answered.   

## 2011-08-18 ENCOUNTER — Telehealth: Payer: Self-pay | Admitting: *Deleted

## 2011-08-18 DIAGNOSIS — Z0389 Encounter for observation for other suspected diseases and conditions ruled out: Secondary | ICD-10-CM

## 2011-08-18 DIAGNOSIS — Z0000 Encounter for general adult medical examination without abnormal findings: Secondary | ICD-10-CM

## 2011-08-18 NOTE — Telephone Encounter (Signed)
Labs entered.

## 2011-08-26 ENCOUNTER — Encounter: Payer: Self-pay | Admitting: Internal Medicine

## 2011-09-12 ENCOUNTER — Telehealth: Payer: Self-pay

## 2011-09-12 NOTE — Telephone Encounter (Signed)
A user error has taken place: encounter opened in error, closed for administrative reasons.

## 2011-09-15 ENCOUNTER — Ambulatory Visit (AMBULATORY_SURGERY_CENTER): Payer: PRIVATE HEALTH INSURANCE

## 2011-09-15 ENCOUNTER — Encounter: Payer: Self-pay | Admitting: Internal Medicine

## 2011-09-15 VITALS — Ht 71.0 in | Wt 200.0 lb

## 2011-09-15 DIAGNOSIS — Z1211 Encounter for screening for malignant neoplasm of colon: Secondary | ICD-10-CM

## 2011-09-15 LAB — CBC
HCT: 38.5 — ABNORMAL LOW
Hemoglobin: 12.7 — ABNORMAL LOW
Hemoglobin: 13.1
Hemoglobin: 13.6
MCHC: 33.9
MCHC: 34
MCHC: 34.1
MCV: 93
Platelets: 215
Platelets: 217
RBC: 4.08 — ABNORMAL LOW
RBC: 4.14 — ABNORMAL LOW
RBC: 4.14 — ABNORMAL LOW
RDW: 13.2
RDW: 13.3
WBC: 7.3
WBC: 8.6

## 2011-09-15 LAB — COMPREHENSIVE METABOLIC PANEL
ALT: 19
AST: 23
Albumin: 2.9 — ABNORMAL LOW
Alkaline Phosphatase: 52
BUN: 14
CO2: 27
Calcium: 8.7
Chloride: 106
Creatinine, Ser: 1.39
GFR calc Af Amer: >60
GFR calc non Af Amer: 53 — ABNORMAL LOW
Glucose, Bld: 113 — ABNORMAL HIGH
Potassium: 3.8
Sodium: 138
Total Bilirubin: 0.7
Total Protein: 5.7 — ABNORMAL LOW

## 2011-09-15 LAB — FIBRINOGEN: Fibrinogen: 499 — ABNORMAL HIGH

## 2011-09-15 LAB — BETA-2-GLYCOPROTEIN I ABS, IGG/M/A
Beta-2 Glyco I IgG: <4 U/mL (ref <20)
Beta-2-Glycoprotein I IgA: <4 U/mL (ref <10)
Beta-2-Glycoprotein I IgM: 4 U/mL (ref <10)

## 2011-09-15 LAB — PROTIME-INR
INR: 1
INR: 1.1
INR: 1.1
Prothrombin Time: 13.7
Prothrombin Time: 14.5
Prothrombin Time: 14.5

## 2011-09-15 LAB — CARDIAC PANEL(CRET KIN+CKTOT+MB+TROPI)
CK, MB: 2
CK, MB: 2.3
CK, MB: 2.3
Relative Index: 1.2
Relative Index: 1.4
Relative Index: 1.5
Total CK: 146
Total CK: 151
Total CK: 187
Troponin I: 0.05
Troponin I: 0.08 — ABNORMAL HIGH

## 2011-09-15 LAB — APTT: aPTT: 29

## 2011-09-15 LAB — PSA: PSA: 0.67

## 2011-09-15 LAB — CARDIOLIPIN ANTIBODIES, IGG, IGM, IGA
Anticardiolipin IgG: <7 — ABNORMAL LOW (ref <11)
Anticardiolipin IgM: <7 — ABNORMAL LOW (ref <10)

## 2011-09-15 LAB — DIFFERENTIAL
Basophils Absolute: 0
Basophils Relative: 1
Eosinophils Absolute: 0.3
Eosinophils Relative: 4
Lymphocytes Relative: 37
Lymphs Abs: 2.8
Monocytes Absolute: 0.7
Monocytes Relative: 10
Neutro Abs: 3.7
Neutrophils Relative %: 48

## 2011-09-15 LAB — LIPID PANEL
Cholesterol: 124
HDL: 36 — ABNORMAL LOW
LDL Cholesterol: 76
Total CHOL/HDL Ratio: 3.4
Triglycerides: 59
VLDL: 12

## 2011-09-15 LAB — BASIC METABOLIC PANEL
Calcium: 9
GFR calc Af Amer: >60
GFR calc non Af Amer: 51 — ABNORMAL LOW
Sodium: 139

## 2011-09-15 LAB — HOMOCYSTEINE: Homocysteine: 10.8

## 2011-09-15 LAB — FREE PSA
PSA, Free Pct: 45 (ref >25)
PSA, Free: 0.3

## 2011-09-15 LAB — LUPUS ANTICOAGULANT PANEL
Drvvt confirmation: 1.16 (ref <1.18)
PTTLA 4:1 Mix: 83.4 — ABNORMAL HIGH (ref 36.3–48.8)
PTTLA Confirmation: 5.8 (ref <8.0)
dRVVT Incubated 1:1 Mix: 54.8 — ABNORMAL HIGH (ref 36.1–47.0)

## 2011-09-15 LAB — HEPARIN LEVEL (UNFRACTIONATED): Heparin Unfractionated: 0.32

## 2011-09-15 LAB — CREATININE, SERUM
Creatinine, Ser: 1.23
GFR calc Af Amer: >60
GFR calc non Af Amer: >60

## 2011-09-15 LAB — D-DIMER, QUANTITATIVE: D-Dimer, Quant: 8.75 — ABNORMAL HIGH

## 2011-09-15 LAB — BUN: BUN: 13

## 2011-09-15 LAB — ANTITHROMBIN III: AntiThromb III Func: 91 (ref 76–126)

## 2011-09-15 LAB — PROTEIN S ACTIVITY: Protein S Activity: 115 % (ref 69–129)

## 2011-09-15 MED ORDER — PEG-KCL-NACL-NASULF-NA ASC-C 100 G PO SOLR: 1.0000 | Freq: Once | ORAL | Status: DC

## 2011-09-29 ENCOUNTER — Encounter: Payer: Self-pay | Admitting: Internal Medicine

## 2011-09-29 ENCOUNTER — Ambulatory Visit (AMBULATORY_SURGERY_CENTER): Payer: PRIVATE HEALTH INSURANCE | Admitting: Internal Medicine

## 2011-09-29 VITALS — BP 118/68 | HR 54 | Temp 96.0°F | Resp 21 | Ht 71.0 in | Wt 200.0 lb

## 2011-09-29 DIAGNOSIS — Z1211 Encounter for screening for malignant neoplasm of colon: Secondary | ICD-10-CM

## 2011-09-29 DIAGNOSIS — K5730 Diverticulosis of large intestine without perforation or abscess without bleeding: Secondary | ICD-10-CM

## 2011-09-29 MED ORDER — SODIUM CHLORIDE 0.9 % IV SOLN: 500.0000 mL | INTRAVENOUS | Status: DC

## 2011-09-29 NOTE — Progress Notes (Signed)
Pt very drowsy in the RR.  Kept an additional 10 minutes in order for pt to wake up more

## 2011-09-29 NOTE — Progress Notes (Signed)
BP reading 50-80/30-50, patient laying on arm with bp cuff, grimacing, moaning and reaching for scope, responds appropriately when name called and asked to breathe

## 2011-09-29 NOTE — Patient Instructions (Signed)
Please review discharge instructions (blue and green sheets)  Resume regular medications  Read information about diverticulosis and high fiber diets

## 2011-09-30 ENCOUNTER — Telehealth: Payer: Self-pay | Admitting: *Deleted

## 2011-09-30 NOTE — Telephone Encounter (Signed)
Follow up Call- Patient questions:  Do you have a fever, pain , or abdominal swelling? no Pain Score  0 *  Have you tolerated food without any problems? yes  Have you been able to return to your normal activities? yes  Do you have any questions about your discharge instructions: Diet   no Medications  no Follow up visit  no  Do you have questions or concerns about your Care? no  Actions: * If pain score is 4 or above: No action needed, pain <4. Patient stating he went to [facility] after discharge,"had to have something greasy."Denies abdominal discomfort or any problems.

## 2012-03-15 ENCOUNTER — Ambulatory Visit: Payer: PRIVATE HEALTH INSURANCE | Admitting: Internal Medicine

## 2012-04-06 ENCOUNTER — Ambulatory Visit (INDEPENDENT_AMBULATORY_CARE_PROVIDER_SITE_OTHER): Payer: PRIVATE HEALTH INSURANCE | Admitting: Internal Medicine

## 2012-04-06 ENCOUNTER — Encounter: Payer: Self-pay | Admitting: Internal Medicine

## 2012-04-06 VITALS — BP 110/62 | HR 64 | Temp 97.7°F | Ht 71.5 in | Wt 207.0 lb

## 2012-04-06 DIAGNOSIS — H6122 Impacted cerumen, left ear: Secondary | ICD-10-CM

## 2012-04-06 DIAGNOSIS — H6120 Impacted cerumen, unspecified ear: Secondary | ICD-10-CM

## 2012-04-06 NOTE — Patient Instructions (Signed)
It was good to see you today. Your ear has been irrigated of wax today -let us know if ear problems persist for referral to audiologist and hearing testing

## 2012-04-06 NOTE — Progress Notes (Signed)
  Subjective:    Patient ID: Jorge Rogers, male    DOB: 1951-08-08, 61 y.o.   MRN: [id number]  HPI  complains of clogged feeling in L ear No drainage, pain or hearing change  Denies foreign body  Onset 2 weeks ago, unchanged No history of same problems  Past Medical History  Diagnosis Date  . Pulmonary embolism 07/2007    B PE and RLE DVT    Review of Systems  Constitutional: Negative for fever.  HENT: Negative for facial swelling, sinus pressure and tinnitus.   Eyes: Negative for pain and visual disturbance.       Objective:   Physical Exam BP 110/62  Pulse 64  Temp(Src) 97.7 F (36.5 C) (Oral)  Ht 5' 11.5" (1.816 m)  Wt 207 lb (93.895 kg)  BMI 28.47 kg/m2  SpO2 99% Constitutional:  He appears well-developed and well-nourished. No distress.  HENT: no pinna swelling or tenderness to palpation. L ear with cerumen impaction. After irrigation, B TMs intact without erythema or effusion - OP clear without erythema or exudate Eyes: PERRL, EOMI, no conjunctivitis Neck: Normal range of motion. Neck supple. No JVD or LAD present. No thyromegaly present.  Cardiovascular: Normal rate, regular rhythm and normal heart sounds.  No murmur heard. no BLE edema Pulmonary/Chest: Effort normal and breath sounds normal. No respiratory distress. no wheezes.   Procedure: wax removal Reason: wax impaction Risks and benefits of procedure discussed with the patient who agrees to proceed. Ear(s) irrigated with warm water. Large amount of wax removed. Instrumentation with metal ear loop was performed to accomplish wax removal. the patient tolerated procedure well.      Assessment & Plan:  L cerumen impaction - irrigation as above - symptoms improved, no signs of infection

## 2012-08-20 ENCOUNTER — Encounter: Payer: PRIVATE HEALTH INSURANCE | Admitting: Internal Medicine

## 2012-10-04 ENCOUNTER — Encounter: Payer: Self-pay | Admitting: Internal Medicine

## 2012-10-04 ENCOUNTER — Ambulatory Visit (INDEPENDENT_AMBULATORY_CARE_PROVIDER_SITE_OTHER): Payer: PRIVATE HEALTH INSURANCE | Admitting: Internal Medicine

## 2012-10-04 VITALS — BP 130/90 | HR 80 | Temp 97.3°F | Resp 16 | Wt 199.0 lb

## 2012-10-04 DIAGNOSIS — R1031 Right lower quadrant pain: Secondary | ICD-10-CM

## 2012-10-04 MED ORDER — TRAMADOL HCL 50 MG PO TABS: 50.0000 mg | ORAL_TABLET | Freq: Two times a day (BID) | ORAL | Status: DC | PRN

## 2012-10-04 MED ORDER — IBUPROFEN 600 MG PO TABS: 600.0000 mg | ORAL_TABLET | Freq: Three times a day (TID) | ORAL | Status: DC | PRN

## 2012-10-04 NOTE — Progress Notes (Signed)
Subjective:    Patient ID: Jorge Rogers, male    DOB: 06-Jul-1951, 61 y.o.   MRN: [id number]  Groin Pain This is a new problem. The current episode started in the past 7 days. The problem occurs intermittently. The problem has been unchanged. The pain is severe. Pertinent negatives include no chest pain, coughing, diarrhea, frequency, nausea, rash or sore throat. The symptoms are aggravated by activity. The treatment provided mild relief.    .Review of Systems  Constitutional: Negative for appetite change, fatigue and unexpected weight change.  HENT: Negative for nosebleeds, congestion, sore throat, sneezing, trouble swallowing and neck pain.   Eyes: Negative for itching and visual disturbance.  Respiratory: Negative for cough.   Cardiovascular: Negative for chest pain, palpitations and leg swelling.  Gastrointestinal: Negative for nausea, diarrhea, blood in stool and abdominal distention.  Genitourinary: Negative for frequency and hematuria.  Musculoskeletal: Negative for back pain, joint swelling and gait problem.  Skin: Negative for rash.  Neurological: Negative for dizziness, tremors, speech difficulty and weakness.  Psychiatric/Behavioral: Negative for sleep disturbance, dysphoric mood and agitation. The patient is not nervous/anxious.        Objective:   Physical Exam  Constitutional: He is oriented to person, place, and time. He appears well-developed and well-nourished. No distress.  HENT:  Head: Normocephalic and atraumatic.  Right Ear: External ear normal.  Left Ear: External ear normal.  Nose: Nose normal.  Mouth/Throat: Oropharynx is clear and moist. No oropharyngeal exudate.  Eyes: Conjunctivae normal and EOM are normal. Pupils are equal, round, and reactive to light. Right eye exhibits no discharge. Left eye exhibits no discharge. No scleral icterus.  Neck: Normal range of motion. Neck supple. No JVD present. No tracheal deviation present. No thyromegaly present.    Cardiovascular: Normal rate, regular rhythm, normal heart sounds and intact distal pulses.  Exam reveals no gallop and no friction rub.   No murmur heard. Pulmonary/Chest: Effort normal and breath sounds normal. No stridor. No respiratory distress. He has no wheezes. He has no rales. He exhibits no tenderness.  Abdominal: Soft. Bowel sounds are normal. He exhibits no distension and no mass. There is no tenderness. There is no rebound and no guarding.  Genitourinary: Rectum normal, prostate normal and penis normal. Guaiac negative stool. No penile tenderness.  Musculoskeletal: Normal range of motion. He exhibits no edema and no tenderness.  Lymphadenopathy:    He has no cervical adenopathy.  Neurological: He is alert and oriented to person, place, and time. He has normal reflexes. No cranial nerve deficit. He exhibits normal muscle tone. Coordination normal.  Skin: Skin is warm and dry. No rash noted. He is not diaphoretic. No erythema. No pallor.  Psychiatric: He has a normal mood and affect. His behavior is normal. Judgment and thought content normal.  R groin is tender - no hernia abd S/NT Lab Results  Component Value Date   WBC 8.3 08/13/2011   HGB 14.6 08/13/2011   HCT 43.8 08/13/2011   PLT 247.0 08/13/2011   CHOL 168 08/13/2011   TRIG 108.0 08/13/2011   HDL 49.90 08/13/2011   ALT 15 08/13/2011   AST 23 08/13/2011   NA 142 08/13/2011   K 3.9 08/13/2011   CL 104 08/13/2011   CREATININE 1.2 08/13/2011   BUN 13 08/13/2011   CO2 24 08/13/2011   TSH 1.26 08/13/2011   PSA 1.08 08/13/2011   INR 2.2 RATIO* 07/15/2007          Assessment & Plan:

## 2012-10-04 NOTE — Assessment & Plan Note (Signed)
11/13 R MSK strain Ibuprofen Tramadol prn Stretch Off work till [employer]

## 2012-10-29 ENCOUNTER — Other Ambulatory Visit: Payer: Self-pay | Admitting: Internal Medicine

## 2012-11-09 ENCOUNTER — Encounter: Payer: PRIVATE HEALTH INSURANCE | Admitting: Internal Medicine

## 2012-12-22 ENCOUNTER — Ambulatory Visit (INDEPENDENT_AMBULATORY_CARE_PROVIDER_SITE_OTHER): Payer: PRIVATE HEALTH INSURANCE | Admitting: Internal Medicine

## 2012-12-22 ENCOUNTER — Encounter: Payer: Self-pay | Admitting: Internal Medicine

## 2012-12-22 VITALS — BP 102/70 | HR 76 | Temp 97.2°F | Resp 16 | Ht 71.5 in | Wt 207.0 lb

## 2012-12-22 DIAGNOSIS — B079 Viral wart, unspecified: Secondary | ICD-10-CM

## 2012-12-22 DIAGNOSIS — Z23 Encounter for immunization: Secondary | ICD-10-CM

## 2012-12-22 DIAGNOSIS — Z0000 Encounter for general adult medical examination without abnormal findings: Secondary | ICD-10-CM

## 2012-12-22 MED ORDER — CICLOPIROX 8 % EX SOLN: Freq: Every day | CUTANEOUS | Status: DC

## 2012-12-22 NOTE — Patient Instructions (Signed)
   Postprocedure instructions :     Keep the wounds clean. You can wash them with liquid soap and water. Pat dry with gauze or a Kleenex tissue  Before applying antibiotic ointment and a Band-Aid.   You need to report immediately  if  any signs of infection develop.    

## 2012-12-22 NOTE — Assessment & Plan Note (Signed)
We discussed age appropriate health related issues, including available/recomended screening tests and vaccinations. We discussed a need for adhering to healthy diet and exercise. Labs/EKG were reviewed/ordered. All questions were answered.   

## 2012-12-22 NOTE — Progress Notes (Signed)
Subjective:    Patient ID: Jorge Rogers, male    DOB: 1950-12-09, 62 y.o.   MRN: [id number]  HPI  The patient is here for a wellness exam. The patient has been doing well overall without major physical or psychological issues going on lately. F/u on GERD    Review of Systems  Constitutional: Negative for appetite change, fatigue and unexpected weight change.  HENT: Negative for nosebleeds, congestion, sore throat, sneezing, trouble swallowing and neck pain.   Eyes: Negative for itching and visual disturbance.  Respiratory: Negative for cough.   Cardiovascular: Negative for chest pain, palpitations and leg swelling.  Gastrointestinal: Negative for nausea, diarrhea, blood in stool and abdominal distention.  Genitourinary: Negative for frequency and hematuria.  Musculoskeletal: Negative for back pain, joint swelling and gait problem.  Skin: Negative for rash.  Neurological: Negative for dizziness, tremors, speech difficulty and weakness.  Psychiatric/Behavioral: Negative for suicidal ideas, sleep disturbance, dysphoric mood and agitation. The patient is not nervous/anxious.        Objective:   Physical Exam  Constitutional: He is oriented to person, place, and time. He appears well-developed and well-nourished. No distress.  HENT:  Head: Normocephalic and atraumatic.  Right Ear: External ear normal.  Left Ear: External ear normal.  Nose: Nose normal.  Mouth/Throat: Oropharynx is clear and moist. No oropharyngeal exudate.  Eyes: Conjunctivae normal and EOM are normal. Pupils are equal, round, and reactive to light. Right eye exhibits no discharge. Left eye exhibits no discharge. No scleral icterus.  Neck: Normal range of motion. Neck supple. No JVD present. No tracheal deviation present. No thyromegaly present.  Cardiovascular: Normal rate, regular rhythm, normal heart sounds and intact distal pulses.  Exam reveals no gallop and no friction rub.   No murmur heard. Pulmonary/Chest:  Effort normal and breath sounds normal. No stridor. No respiratory distress. He has no wheezes. He has no rales. He exhibits no tenderness.  Abdominal: Soft. Bowel sounds are normal. He exhibits no distension and no mass. There is no tenderness. There is no rebound and no guarding.  Genitourinary: Rectum normal, prostate normal and penis normal. Guaiac negative stool. No penile tenderness.  Musculoskeletal: Normal range of motion. He exhibits no edema and no tenderness.  Lymphadenopathy:    He has no cervical adenopathy.  Neurological: He is alert and oriented to person, place, and time. He has normal reflexes. No cranial nerve deficit. He exhibits normal muscle tone. Coordination normal.  Skin: Skin is warm and dry. No rash noted. He is not diaphoretic. No erythema. No pallor.  Psychiatric: He has a normal mood and affect. His behavior is normal. Judgment and thought content normal.      Procedure Note :     Procedure : Cryosurgery   Indication:  Wart(s)     Risks including unsuccessful procedure , bleeding, infection, bruising, scar, a need for a repeat  procedure and others were explained to the patient in detail as well as the benefits. Informed consent was obtained verbally.   1  lesion(s) was/were treated with liquid nitrogen on a Q-tip in a usual fasion . Band-Aid was applied and antibiotic ointment was given for a later use.   Tolerated well. Complications none.   Postprocedure instructions :     Keep the wounds clean. You can wash them with liquid soap and water. Pat dry with gauze or a Kleenex tissue  Before applying antibiotic ointment and a Band-Aid.   You need to report immediately  if  any  signs of infection develop.        Assessment & Plan:

## 2012-12-23 ENCOUNTER — Other Ambulatory Visit (INDEPENDENT_AMBULATORY_CARE_PROVIDER_SITE_OTHER): Payer: PRIVATE HEALTH INSURANCE

## 2012-12-23 DIAGNOSIS — Z0000 Encounter for general adult medical examination without abnormal findings: Secondary | ICD-10-CM

## 2012-12-23 LAB — CBC WITH DIFFERENTIAL/PLATELET
Basophils Absolute: 0 10*3/uL (ref 0.0–0.1)
Eosinophils Absolute: 0.2 10*3/uL (ref 0.0–0.7)
HCT: 40.3 % (ref 39.0–52.0)
Lymphs Abs: 2.8 10*3/uL (ref 0.7–4.0)
MCHC: 34.2 g/dL (ref 30.0–36.0)
MCV: 91.8 fl (ref 78.0–100.0)
Monocytes Absolute: 0.7 10*3/uL (ref 0.1–1.0)
Platelets: 240 10*3/uL (ref 150.0–400.0)
RDW: 13.4 % (ref 11.5–14.6)

## 2012-12-23 LAB — URINALYSIS, ROUTINE W REFLEX MICROSCOPIC
Bilirubin Urine: NEGATIVE
Ketones, ur: NEGATIVE
Total Protein, Urine: NEGATIVE
Urine Glucose: NEGATIVE

## 2012-12-23 LAB — BASIC METABOLIC PANEL
BUN: 20 mg/dL (ref 6–23)
Chloride: 104 mEq/L (ref 96–112)
Glucose, Bld: 99 mg/dL (ref 70–99)
Potassium: 4.3 mEq/L (ref 3.5–5.1)
Sodium: 137 mEq/L (ref 135–145)

## 2012-12-23 LAB — LIPID PANEL
Cholesterol: 144 mg/dL (ref 0–200)
LDL Cholesterol: 79 mg/dL (ref 0–99)

## 2012-12-23 LAB — HEPATIC FUNCTION PANEL
ALT: 29 U/L (ref 0–53)
AST: 31 U/L (ref 0–37)
Alkaline Phosphatase: 60 U/L (ref 39–117)
Bilirubin, Direct: 0.1 mg/dL (ref 0.0–0.3)
Total Protein: 7 g/dL (ref 6.0–8.3)

## 2012-12-26 DIAGNOSIS — B079 Viral wart, unspecified: Secondary | ICD-10-CM | POA: Insufficient documentation

## 2012-12-26 NOTE — Assessment & Plan Note (Signed)
Cryo  

## 2013-06-20 ENCOUNTER — Encounter: Payer: Self-pay | Admitting: Internal Medicine

## 2013-06-20 ENCOUNTER — Ambulatory Visit: Payer: PRIVATE HEALTH INSURANCE

## 2013-06-20 ENCOUNTER — Telehealth: Payer: Self-pay | Admitting: *Deleted

## 2013-06-20 ENCOUNTER — Ambulatory Visit (INDEPENDENT_AMBULATORY_CARE_PROVIDER_SITE_OTHER): Payer: PRIVATE HEALTH INSURANCE | Admitting: Internal Medicine

## 2013-06-20 VITALS — BP 116/82 | HR 80 | Temp 97.4°F | Resp 16 | Wt 200.0 lb

## 2013-06-20 DIAGNOSIS — R509 Fever, unspecified: Secondary | ICD-10-CM

## 2013-06-20 DIAGNOSIS — N390 Urinary tract infection, site not specified: Secondary | ICD-10-CM

## 2013-06-20 LAB — BASIC METABOLIC PANEL
BUN: 15 mg/dL (ref 6–23)
Chloride: 101 mEq/L (ref 96–112)
GFR: 61.47 mL/min (ref >60.00)
Glucose, Bld: 102 mg/dL — ABNORMAL HIGH (ref 70–99)
Potassium: 4.2 mEq/L (ref 3.5–5.1)

## 2013-06-20 LAB — CBC WITH DIFFERENTIAL/PLATELET
Basophils Relative: 0.2 % (ref 0.0–3.0)
Eosinophils Relative: 0.1 % (ref 0.0–5.0)
Hemoglobin: 15.4 g/dL (ref 13.0–17.0)
MCV: 96.1 fl (ref 78.0–100.0)
Monocytes Absolute: 3 10*3/uL — ABNORMAL HIGH (ref 0.1–1.0)
Neutrophils Relative %: 77.8 % — ABNORMAL HIGH (ref 43.0–77.0)
RBC: 4.71 Mil/uL (ref 4.22–5.81)
WBC: 24.2 10*3/uL (ref 4.5–10.5)

## 2013-06-20 NOTE — Telephone Encounter (Signed)
[facility] Triage Call Report Triage Record Num: [id number] Operator: Roselyn Meier Patient Name: Jorge Rogers Call Date & Time: 06/19/2013 6:06:26PM Patient Phone: [phone] PCP: Sonda Primes Patient Gender: Male PCP Fax : [phone] Patient DOB: [birth year] Practice Name: [facility] Reason for Call: Caller: Amarrion/Patient; PCP: Sonda Primes (Adults only); CB#: [id number]; Call regarding Fever X. 3. Days; Onset 06/16/13 with fever off and of. Pt states his fever is now 103.4-oral. Pt is taking Alka-Seltzer Cold Plus. Triaged patient per Abdominal Pain Protocol. See Provider within 24 Hours Disposition for 'Pain/discomfort over bladder and urinary tract symptoms'. Care advice given. Appt scheduled for 06/20/13 at 0800 with Dr Posey Rea Protocol(s) Used: Abdominal Pain Protocol(s) Used: Fever - Adult Recommended Outcome per Protocol: See Provider within 24 hours Reason for Outcome: Abdominal pain is present Pain/discomfort over bladder AND urinary tract symptoms Care Advice: ~ 06/19/2013 6:18:15PM Page 1 of 1 [email or web address]

## 2013-06-21 ENCOUNTER — Ambulatory Visit: Payer: PRIVATE HEALTH INSURANCE

## 2013-06-21 ENCOUNTER — Encounter: Payer: Self-pay | Admitting: Internal Medicine

## 2013-06-21 ENCOUNTER — Telehealth: Payer: Self-pay | Admitting: *Deleted

## 2013-06-21 DIAGNOSIS — R509 Fever, unspecified: Secondary | ICD-10-CM

## 2013-06-21 DIAGNOSIS — N390 Urinary tract infection, site not specified: Secondary | ICD-10-CM | POA: Insufficient documentation

## 2013-06-21 LAB — URINALYSIS, ROUTINE W REFLEX MICROSCOPIC
Nitrite: POSITIVE
Total Protein, Urine: 100
WBC, UA: NONE SEEN (ref 0–?)
pH: 5.5 (ref 5.0–8.0)

## 2013-06-21 NOTE — Assessment & Plan Note (Signed)
7/14 likely pyelo vs prostatitis UA, CBC, BMET Abd [place] Cipro To ER if worse

## 2013-06-21 NOTE — Assessment & Plan Note (Signed)
Labs Cipro

## 2013-06-21 NOTE — Progress Notes (Signed)
Patient ID: Jorge Rogers, male   DOB: [birth year], 62 y.o.   MRN: [id number]  Subjective:    Patient ID: Jorge Rogers, male    DOB: Feb 10, 1951, 62 y.o.   MRN: [id number]  Fever  This is a new problem. The current episode started in the past 7 days. The problem occurs 2 to 4 times per day. His temperature was unmeasured prior to arrival. Pertinent negatives include no chest pain, congestion, coughing, diarrhea, nausea, rash or sore throat. He has tried acetaminophen for the symptoms. The treatment provided mild relief.  Urinary Frequency  This is a new problem. The current episode started in the past 7 days. The problem occurs every urination. The patient is experiencing no pain. He is sexually active. Associated symptoms include chills and sweats. Pertinent negatives include no frequency, hematuria or nausea. He has tried acetaminophen for the symptoms.     F/u on GERD   NAD Review of Systems  Constitutional: Positive for fever and chills. Negative for appetite change, fatigue and unexpected weight change.  HENT: Negative for nosebleeds, congestion, sore throat, sneezing, trouble swallowing and neck pain.   Eyes: Negative for itching and visual disturbance.  Respiratory: Negative for cough.   Cardiovascular: Negative for chest pain, palpitations and leg swelling.  Gastrointestinal: Negative for nausea, diarrhea, blood in stool and abdominal distention.  Genitourinary: Negative for frequency and hematuria.  Musculoskeletal: Negative for back pain, joint swelling and gait problem.  Skin: Negative for rash.  Neurological: Negative for dizziness, tremors, speech difficulty and weakness.  Psychiatric/Behavioral: Negative for suicidal ideas, sleep disturbance, dysphoric mood and agitation. The patient is not nervous/anxious.        Objective:   Physical Exam  Constitutional: He is oriented to person, place, and time. He appears well-developed and well-nourished. No distress.  HENT:  Head:  Normocephalic and atraumatic.  Right Ear: External ear normal.  Left Ear: External ear normal.  Nose: Nose normal.  Mouth/Throat: Oropharynx is clear and moist. No oropharyngeal exudate.  Eyes: Conjunctivae and EOM are normal. Pupils are equal, round, and reactive to light. Right eye exhibits no discharge. Left eye exhibits no discharge. No scleral icterus.  Neck: Normal range of motion. Neck supple. No JVD present. No tracheal deviation present. No thyromegaly present.  Cardiovascular: Normal rate, regular rhythm, normal heart sounds and intact distal pulses.  Exam reveals no gallop and no friction rub.   No murmur heard. Pulmonary/Chest: Effort normal and breath sounds normal. No stridor. No respiratory distress. He has no wheezes. He has no rales. He exhibits no tenderness.  Abdominal: Soft. Bowel sounds are normal. He exhibits no distension and no mass. There is no tenderness. There is no rebound and no guarding.  Genitourinary: Rectum normal.  Musculoskeletal: Normal range of motion. He exhibits no edema and no tenderness.  Lymphadenopathy:    He has no cervical adenopathy.  Neurological: He is alert and oriented to person, place, and time. He has normal reflexes. No cranial nerve deficit. He exhibits normal muscle tone. Coordination normal.  Skin: Skin is warm and dry. No rash noted. He is not diaphoretic. No erythema. No pallor.  Psychiatric: He has a normal mood and affect. His behavior is normal. Judgment and thought content normal.    Lab Results  Component Value Date   WBC 24.2* 06/20/2013   HGB 15.4 06/20/2013   HCT 45.2 06/20/2013   PLT 194.0 06/20/2013   GLUCOSE 102* 06/20/2013   CHOL 144 12/23/2012   TRIG 161.0* 12/23/2012  HDL 33.10* 12/23/2012   LDLCALC 79 12/23/2012   ALT 29 12/23/2012   AST 31 12/23/2012   NA 135 06/20/2013   K 4.2 06/20/2013   CL 101 06/20/2013   CREATININE 1.5 06/20/2013   BUN 15 06/20/2013   CO2 27 06/20/2013   TSH 1.26 08/13/2011   PSA 1.03 12/23/2012    INR 2.2 RATIO* 07/15/2007   MICROALBUR 35.7 Repeated and verified X2.* 06/20/2013           Assessment & Plan:

## 2013-06-21 NOTE — Telephone Encounter (Signed)
Ok Thx 

## 2013-06-21 NOTE — Telephone Encounter (Signed)
Pt advised of lab results as per MD result note.  Pt requests a work note for yesterday and today and a copy of his lab results.

## 2013-06-22 ENCOUNTER — Encounter: Payer: Self-pay | Admitting: *Deleted

## 2013-06-22 NOTE — Telephone Encounter (Signed)
Left message on VM.  Note may be picked up this afternoon.

## 2013-06-27 ENCOUNTER — Other Ambulatory Visit: Payer: Self-pay | Admitting: *Deleted

## 2013-06-27 DIAGNOSIS — R509 Fever, unspecified: Secondary | ICD-10-CM

## 2013-06-27 DIAGNOSIS — N390 Urinary tract infection, site not specified: Secondary | ICD-10-CM

## 2013-07-04 ENCOUNTER — Other Ambulatory Visit (INDEPENDENT_AMBULATORY_CARE_PROVIDER_SITE_OTHER): Payer: PRIVATE HEALTH INSURANCE

## 2013-07-04 ENCOUNTER — Other Ambulatory Visit: Payer: Self-pay | Admitting: Internal Medicine

## 2013-07-04 DIAGNOSIS — N390 Urinary tract infection, site not specified: Secondary | ICD-10-CM

## 2013-07-04 DIAGNOSIS — R509 Fever, unspecified: Secondary | ICD-10-CM

## 2013-07-04 LAB — URINALYSIS, ROUTINE W REFLEX MICROSCOPIC
Bilirubin Urine: NEGATIVE
Nitrite: NEGATIVE
Total Protein, Urine: NEGATIVE

## 2013-07-04 LAB — CBC WITH DIFFERENTIAL/PLATELET
Basophils Absolute: 0 10*3/uL (ref 0.0–0.1)
HCT: 38.9 % — ABNORMAL LOW (ref 39.0–52.0)
Lymphs Abs: 2.8 10*3/uL (ref 0.7–4.0)
MCV: 94.6 fl (ref 78.0–100.0)
Monocytes Absolute: 0.8 10*3/uL (ref 0.1–1.0)
Platelets: 340 10*3/uL (ref 150.0–400.0)
RDW: 13.9 % (ref 11.5–14.6)

## 2013-07-22 ENCOUNTER — Other Ambulatory Visit: Payer: Self-pay | Admitting: Internal Medicine

## 2013-07-27 NOTE — Telephone Encounter (Signed)
Ov if problems

## 2014-05-30 ENCOUNTER — Ambulatory Visit (INDEPENDENT_AMBULATORY_CARE_PROVIDER_SITE_OTHER): Payer: PRIVATE HEALTH INSURANCE | Admitting: Internal Medicine

## 2014-05-30 ENCOUNTER — Encounter: Payer: Self-pay | Admitting: Internal Medicine

## 2014-05-30 VITALS — BP 110/76 | HR 76 | Temp 98.0°F | Resp 16 | Wt 199.0 lb

## 2014-05-30 DIAGNOSIS — J209 Acute bronchitis, unspecified: Secondary | ICD-10-CM

## 2014-05-30 MED ORDER — PROMETHAZINE-CODEINE 6.25-10 MG/5ML PO SYRP: 5.0000 mL | ORAL_SOLUTION | ORAL | Status: DC | PRN

## 2014-05-30 MED ORDER — AZITHROMYCIN 250 MG PO TABS: ORAL_TABLET | ORAL | Status: DC

## 2014-05-30 NOTE — Progress Notes (Signed)
Pre visit review using our clinic review tool, if applicable. No additional management support is needed unless otherwise documented below in the visit note. 

## 2014-05-30 NOTE — Assessment & Plan Note (Signed)
Prom-cod syr Zpac 

## 2014-05-30 NOTE — Progress Notes (Signed)
Patient ID: Jorge Rogers, male   DOB: Jan 27, 1951, 63 y.o.   MRN: [id number] Patient ID: Jorge Rogers, male   DOB: 1951-11-01, 63 y.o.   MRN: [id number]  Subjective:    Patient ID: Jorge Rogers, male    DOB: September 30, 1951, 63 y.o.   MRN: [id number]  URI  This is a new problem. The current episode started in the past 7 days. The maximum temperature recorded prior to his arrival was 100 - 100.9 F. Associated symptoms include congestion, coughing, ear pain, headaches, rhinorrhea, sinus pain, a sore throat and swollen glands. Pertinent negatives include no abdominal pain, chest pain, neck pain, rash or sneezing. He has tried acetaminophen and decongestant for the symptoms. The treatment provided no relief.     F/u on GERD   NAD Review of Systems  Constitutional: Negative for appetite change, fatigue and unexpected weight change.  HENT: Positive for congestion, ear pain, rhinorrhea and sore throat. Negative for nosebleeds, sneezing and trouble swallowing.   Eyes: Negative for itching and visual disturbance.  Respiratory: Positive for cough.   Cardiovascular: Negative for chest pain, palpitations and leg swelling.  Gastrointestinal: Negative for abdominal pain, blood in stool and abdominal distention.  Musculoskeletal: Negative for back pain, gait problem, joint swelling and neck pain.  Skin: Negative for rash.  Neurological: Positive for headaches. Negative for dizziness, tremors, speech difficulty and weakness.  Psychiatric/Behavioral: Negative for suicidal ideas, sleep disturbance, dysphoric mood and agitation. The patient is not nervous/anxious.        Objective:   Physical Exam  Constitutional: He is oriented to person, place, and time. He appears well-developed and well-nourished. No distress.  HENT:  Head: Normocephalic and atraumatic.  Right Ear: External ear normal.  Left Ear: External ear normal.  Nose: Nose normal.  Mouth/Throat: Oropharynx is clear and moist. No oropharyngeal  exudate.  Eyes: Conjunctivae and EOM are normal. Pupils are equal, round, and reactive to light. Right eye exhibits no discharge. Left eye exhibits no discharge. No scleral icterus.  Neck: Normal range of motion. Neck supple. No JVD present. No tracheal deviation present. No thyromegaly present.  Cardiovascular: Normal rate, regular rhythm, normal heart sounds and intact distal pulses.  Exam reveals no gallop and no friction rub.   No murmur heard. Pulmonary/Chest: Effort normal and breath sounds normal. No stridor. No respiratory distress. He has no wheezes. He has no rales. He exhibits no tenderness.  Abdominal: Soft. Bowel sounds are normal. He exhibits no distension and no mass. There is no tenderness. There is no rebound and no guarding.  Genitourinary: Rectum normal.  Musculoskeletal: Normal range of motion. He exhibits no edema and no tenderness.  Lymphadenopathy:    He has no cervical adenopathy.  Neurological: He is alert and oriented to person, place, and time. He has normal reflexes. No cranial nerve deficit. He exhibits normal muscle tone. Coordination normal.  Skin: Skin is warm and dry. No rash noted. He is not diaphoretic. No erythema. No pallor.  Psychiatric: He has a normal mood and affect. His behavior is normal. Judgment and thought content normal.    Lab Results  Component Value Date   WBC 8.1 07/04/2013   HGB 13.1 07/04/2013   HCT 38.9* 07/04/2013   PLT 340.0 07/04/2013   GLUCOSE 102* 06/20/2013   CHOL 144 12/23/2012   TRIG 161.0* 12/23/2012   HDL 33.10* 12/23/2012   LDLCALC 79 12/23/2012   ALT 29 12/23/2012   AST 31 12/23/2012   NA 135 06/20/2013  K 4.2 06/20/2013   CL 101 06/20/2013   CREATININE 1.5 06/20/2013   BUN 15 06/20/2013   CO2 27 06/20/2013   TSH 1.26 08/13/2011   PSA 1.03 12/23/2012   INR 2.2 RATIO* 07/15/2007   MICROALBUR 35.7 Repeated and verified X2.* 06/20/2013           Assessment & Plan:

## 2014-10-20 ENCOUNTER — Encounter: Payer: Self-pay | Admitting: Internal Medicine

## 2014-10-20 ENCOUNTER — Ambulatory Visit (INDEPENDENT_AMBULATORY_CARE_PROVIDER_SITE_OTHER): Payer: PRIVATE HEALTH INSURANCE | Admitting: Internal Medicine

## 2014-10-20 VITALS — BP 110/80 | HR 74 | Temp 98.1°F | Wt 198.5 lb

## 2014-10-20 DIAGNOSIS — Z23 Encounter for immunization: Secondary | ICD-10-CM

## 2014-10-20 DIAGNOSIS — L02511 Cutaneous abscess of right hand: Secondary | ICD-10-CM

## 2014-10-20 MED ORDER — HYDROCODONE-ACETAMINOPHEN 10-325 MG PO TABS: ORAL_TABLET | ORAL | Status: DC

## 2014-10-20 MED ORDER — AMOXICILLIN-POT CLAVULANATE 875-125 MG PO TABS: ORAL_TABLET | ORAL | Status: DC

## 2014-10-20 NOTE — Patient Instructions (Signed)
The Surgery referral will be scheduled today if possible.

## 2014-10-20 NOTE — Progress Notes (Signed)
   Subjective:    Patient ID: Jorge Rogers, male    DOB: Feb 17, 1951, 63 y.o.   MRN: [id number]  HPI   He awoke 10/15/14 with tenderness and swelling over the dorsum of the right hand.   He had been working on an outbuilding the day before. He questioned spider bite but visualized no vector.  The swelling &  tenderness progressed.  As of 11/18 he cleaned a straight pin with alcohol and created a shallow ulcer to drain pus.  The swelling has improved but he continues to have "excruciating" pain in this area. This is worse with dependency  He had night sweats 11/18 and 11/19.  He has not had a tetanus shot for probably greater than10 years        Review of Systems    He has no fever or chills.  There have  been no red streaks radiating up the arm.  He has no abdominal pain , nausea or vomiting  He has noticed some tingling subjectively in the right elbow and forearm area.     Objective:   Physical Exam   Positive or pertinent findings include:  There is a tender area 4 x 4 centimeters of induration of the dorsum of the right hand. There is a 3 x 2 cm abscess. There is a shallow 4 x 2 mm ulcer he created with the needle.  He is able to make a fist but this causes discomfort.  I can appreciate no epitrochlear or axillary lymphadenopathy.  Radial pulses are normal.  Heart rhythm is regular without murmur  Chest is clear with no increased work of breathing.        Assessment & Plan:  #1 abscess ,dorsum of right hand; he is at high risk for compartmental syndrome and tendon injury.  Plan: Stat consultation with Hand Surgeon will be pursued  Updated tetanus shot

## 2014-10-20 NOTE — Progress Notes (Signed)
Pre visit review using our clinic review tool, if applicable. No additional management support is needed unless otherwise documented below in the visit note. 

## 2014-11-29 ENCOUNTER — Emergency Department (HOSPITAL_COMMUNITY)
Admission: EM | Admit: 2014-11-29 | Discharge: 2014-11-29 | Disposition: A | Discharge status: Home / Self Care | Payer: PRIVATE HEALTH INSURANCE | Attending: Emergency Medicine | Admitting: Emergency Medicine

## 2014-11-29 ENCOUNTER — Emergency Department (HOSPITAL_COMMUNITY): Payer: PRIVATE HEALTH INSURANCE

## 2014-11-29 ENCOUNTER — Encounter (HOSPITAL_COMMUNITY): Payer: Self-pay | Admitting: Nurse Practitioner

## 2014-11-29 DIAGNOSIS — Y92410 Unspecified street and highway as the place of occurrence of the external cause: Secondary | ICD-10-CM | POA: Diagnosis not present

## 2014-11-29 DIAGNOSIS — Z86711 Personal history of pulmonary embolism: Secondary | ICD-10-CM | POA: Diagnosis not present

## 2014-11-29 DIAGNOSIS — Y9389 Activity, other specified: Secondary | ICD-10-CM | POA: Diagnosis not present

## 2014-11-29 DIAGNOSIS — S39012A Strain of muscle, fascia and tendon of lower back, initial encounter: Secondary | ICD-10-CM | POA: Diagnosis not present

## 2014-11-29 DIAGNOSIS — S4992XA Unspecified injury of left shoulder and upper arm, initial encounter: Secondary | ICD-10-CM | POA: Insufficient documentation

## 2014-11-29 DIAGNOSIS — Z7982 Long term (current) use of aspirin: Secondary | ICD-10-CM | POA: Insufficient documentation

## 2014-11-29 DIAGNOSIS — M79642 Pain in left hand: Secondary | ICD-10-CM

## 2014-11-29 DIAGNOSIS — Y998 Other external cause status: Secondary | ICD-10-CM | POA: Insufficient documentation

## 2014-11-29 MED ORDER — METHOCARBAMOL 500 MG PO TABS: 500.0000 mg | ORAL_TABLET | Freq: Two times a day (BID) | ORAL | Status: DC

## 2014-11-29 MED ORDER — NAPROXEN 500 MG PO TABS: 500.0000 mg | ORAL_TABLET | Freq: Two times a day (BID) | ORAL | Status: DC
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
## 2014-11-29 NOTE — ED Notes (Signed)
Pt verbalized understanding of all discharge paperwork and Rx.
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
## 2014-12-07 ENCOUNTER — Encounter: Payer: Self-pay | Admitting: Internal Medicine

## 2014-12-07 ENCOUNTER — Ambulatory Visit (INDEPENDENT_AMBULATORY_CARE_PROVIDER_SITE_OTHER): Payer: PRIVATE HEALTH INSURANCE | Admitting: Internal Medicine

## 2014-12-07 VITALS — BP 104/64 | HR 67 | Temp 97.6°F | Ht 71.0 in | Wt 200.0 lb

## 2014-12-07 DIAGNOSIS — M545 Low back pain, unspecified: Secondary | ICD-10-CM

## 2014-12-07 MED ORDER — TRAMADOL HCL 50 MG PO TABS: 50.0000 mg | ORAL_TABLET | Freq: Four times a day (QID) | ORAL | Status: DC | PRN

## 2014-12-07 MED ORDER — PREDNISONE 10 MG PO TABS: ORAL_TABLET | ORAL | Status: DC
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
## 2014-12-07 NOTE — Progress Notes (Signed)
Pre visit review using our clinic review tool, if applicable. No additional management support is needed unless otherwise documented below in the visit note. 

## 2014-12-07 NOTE — Patient Instructions (Signed)
Ok to take the naproxen and robaxin as prescribed from the ER  Also, Please take all new medication as prescribed  - the prednisone, and also the tramadol for the worse pain at night as well  Please continue all other medications as before, and refills have been done if requested.  Please have the pharmacy call with any other refills you may need.  Please keep your appointments with your specialists as you may have planned

## 2014-12-08 DIAGNOSIS — M545 Low back pain, unspecified: Secondary | ICD-10-CM | POA: Insufficient documentation

## 2014-12-08 NOTE — Assessment & Plan Note (Signed)
Exam c/w flare of pain liekly related to now known lumbar DJD/DDD, no neuro changes but pain persistent despite tylenol, ok to take the med rx at ER - nsaid and robaxin, also for low dose predpac asd, and tramadol primarily qhs prn as this is point of maximal pain.  to f/u any worsening symptoms or concerns, consider further imaging if worsens

## 2014-12-18 ENCOUNTER — Encounter: Payer: Self-pay | Admitting: Internal Medicine

## 2014-12-18 ENCOUNTER — Ambulatory Visit (INDEPENDENT_AMBULATORY_CARE_PROVIDER_SITE_OTHER): Payer: PRIVATE HEALTH INSURANCE | Admitting: Internal Medicine

## 2014-12-18 VITALS — BP 132/82 | HR 74 | Temp 98.0°F | Ht 71.0 in | Wt 199.0 lb

## 2014-12-18 DIAGNOSIS — M545 Low back pain, unspecified: Secondary | ICD-10-CM

## 2014-12-18 DIAGNOSIS — Z0000 Encounter for general adult medical examination without abnormal findings: Secondary | ICD-10-CM

## 2014-12-18 MED ORDER — VITAMIN D 1000 UNITS PO TABS: 1000.0000 [IU] | ORAL_TABLET | Freq: Every day | ORAL | Status: AC
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
## 2014-12-18 NOTE — Assessment & Plan Note (Addendum)
We discussed age appropriate health related issues, including available/recomended screening tests and vaccinations. We discussed a need for adhering to healthy diet and exercise. Labs/EKG were reviewed/ordered. All questions were answered.  Pt declined a flu shot Zostavax discussed

## 2014-12-29 ENCOUNTER — Other Ambulatory Visit (INDEPENDENT_AMBULATORY_CARE_PROVIDER_SITE_OTHER): Payer: PRIVATE HEALTH INSURANCE

## 2014-12-29 DIAGNOSIS — Z0000 Encounter for general adult medical examination without abnormal findings: Secondary | ICD-10-CM

## 2014-12-29 LAB — URINALYSIS, ROUTINE W REFLEX MICROSCOPIC
BILIRUBIN URINE: NEGATIVE
Ketones, ur: NEGATIVE
LEUKOCYTES UA: NEGATIVE
NITRITE: NEGATIVE
PH: 7 (ref 5.0–8.0)
Specific Gravity, Urine: 1.02 (ref 1.000–1.030)
Total Protein, Urine: NEGATIVE
URINE GLUCOSE: NEGATIVE
UROBILINOGEN UA: 0.2 (ref 0.0–1.0)

## 2014-12-29 LAB — HEPATIC FUNCTION PANEL
ALT: 19 U/L (ref 0–53)
AST: 22 U/L (ref 0–37)
Albumin: 4.1 g/dL (ref 3.5–5.2)
Alkaline Phosphatase: 58 U/L (ref 39–117)
BILIRUBIN DIRECT: 0.2 mg/dL (ref 0.0–0.3)
TOTAL PROTEIN: 6.9 g/dL (ref 6.0–8.3)
Total Bilirubin: 0.8 mg/dL (ref 0.2–1.2)

## 2014-12-29 LAB — CBC WITH DIFFERENTIAL/PLATELET
BASOS PCT: 0.5 % (ref 0.0–3.0)
Basophils Absolute: 0 10*3/uL (ref 0.0–0.1)
EOS PCT: 2.4 % (ref 0.0–5.0)
Eosinophils Absolute: 0.1 10*3/uL (ref 0.0–0.7)
HEMATOCRIT: 45.1 % (ref 39.0–52.0)
Hemoglobin: 15.5 g/dL (ref 13.0–17.0)
LYMPHS PCT: 45.8 % (ref 12.0–46.0)
Lymphs Abs: 2.7 10*3/uL (ref 0.7–4.0)
MCHC: 34.4 g/dL (ref 30.0–36.0)
MCV: 91.6 fl (ref 78.0–100.0)
MONOS PCT: 10.1 % (ref 3.0–12.0)
Monocytes Absolute: 0.6 10*3/uL (ref 0.1–1.0)
NEUTROS ABS: 2.4 10*3/uL (ref 1.4–7.7)
Neutrophils Relative %: 41.2 % — ABNORMAL LOW (ref 43.0–77.0)
PLATELETS: 192 10*3/uL (ref 150.0–400.0)
RBC: 4.93 Mil/uL (ref 4.22–5.81)
RDW: 14 % (ref 11.5–15.5)
WBC: 5.8 10*3/uL (ref 4.0–10.5)

## 2014-12-29 LAB — BASIC METABOLIC PANEL
BUN: 15 mg/dL (ref 6–23)
CO2: 27 mEq/L (ref 19–32)
Calcium: 9.7 mg/dL (ref 8.4–10.5)
Chloride: 103 mEq/L (ref 96–112)
Creatinine, Ser: 1.24 mg/dL (ref 0.40–1.50)
GFR: 75.61 mL/min (ref >60.00)
Glucose, Bld: 89 mg/dL (ref 70–99)
POTASSIUM: 4 meq/L (ref 3.5–5.1)
SODIUM: 138 meq/L (ref 135–145)

## 2014-12-29 LAB — LIPID PANEL
CHOLESTEROL: 182 mg/dL (ref 0–200)
HDL: 58.9 mg/dL (ref >39.00)
LDL Cholesterol: 100 mg/dL — ABNORMAL HIGH (ref 0–99)
NONHDL: 123.1
TRIGLYCERIDES: 117 mg/dL (ref 0.0–149.0)
Total CHOL/HDL Ratio: 3
VLDL: 23.4 mg/dL (ref 0.0–40.0)

## 2014-12-29 LAB — TSH: TSH: 1.78 u[IU]/mL (ref 0.35–4.50)

## 2014-12-29 LAB — PSA: PSA: 1.49 ng/mL (ref 0.10–4.00)

## 2015-05-29 ENCOUNTER — Ambulatory Visit (INDEPENDENT_AMBULATORY_CARE_PROVIDER_SITE_OTHER): Payer: PRIVATE HEALTH INSURANCE | Admitting: Internal Medicine

## 2015-05-29 ENCOUNTER — Other Ambulatory Visit (INDEPENDENT_AMBULATORY_CARE_PROVIDER_SITE_OTHER): Payer: PRIVATE HEALTH INSURANCE

## 2015-05-29 VITALS — BP 118/78 | HR 91 | Temp 101.5°F | Wt 198.0 lb

## 2015-05-29 DIAGNOSIS — N10 Acute tubulo-interstitial nephritis: Secondary | ICD-10-CM

## 2015-05-29 DIAGNOSIS — R509 Fever, unspecified: Secondary | ICD-10-CM

## 2015-05-29 LAB — BASIC METABOLIC PANEL
BUN: 15 mg/dL (ref 6–23)
CALCIUM: 10.3 mg/dL (ref 8.4–10.5)
CHLORIDE: 98 meq/L (ref 96–112)
CO2: 29 mEq/L (ref 19–32)
Creatinine, Ser: 1.41 mg/dL (ref 0.40–1.50)
GFR: 65.11 mL/min (ref >60.00)
Glucose, Bld: 114 mg/dL — ABNORMAL HIGH (ref 70–99)
Potassium: 4 mEq/L (ref 3.5–5.1)
Sodium: 134 mEq/L — ABNORMAL LOW (ref 135–145)

## 2015-05-29 LAB — CBC WITH DIFFERENTIAL/PLATELET
BASOS ABS: 0.1 10*3/uL (ref 0.0–0.1)
BASOS PCT: 0.6 % (ref 0.0–3.0)
EOS ABS: 0 10*3/uL (ref 0.0–0.7)
EOS PCT: 0.2 % (ref 0.0–5.0)
HEMATOCRIT: 46.3 % (ref 39.0–52.0)
HEMOGLOBIN: 15.8 g/dL (ref 13.0–17.0)
LYMPHS ABS: 2.7 10*3/uL (ref 0.7–4.0)
LYMPHS PCT: 14 % (ref 12.0–46.0)
MCHC: 34.1 g/dL (ref 30.0–36.0)
MCV: 93.3 fl (ref 78.0–100.0)
MONOS PCT: 8.9 % (ref 3.0–12.0)
Monocytes Absolute: 1.7 10*3/uL — ABNORMAL HIGH (ref 0.1–1.0)
NEUTROS ABS: 14.9 10*3/uL — AB (ref 1.4–7.7)
Neutrophils Relative %: 76.3 % (ref 43.0–77.0)
Platelets: 172 10*3/uL (ref 150.0–400.0)
RBC: 4.97 Mil/uL (ref 4.22–5.81)
RDW: 14.1 % (ref 11.5–15.5)

## 2015-05-29 LAB — URINALYSIS, ROUTINE W REFLEX MICROSCOPIC
BILIRUBIN URINE: NEGATIVE
Ketones, ur: NEGATIVE
NITRITE: POSITIVE — AB
Specific Gravity, Urine: 1.02 (ref 1.000–1.030)
Total Protein, Urine: 30 — AB
UROBILINOGEN UA: 1 (ref 0.0–1.0)
Urine Glucose: NEGATIVE
pH: 6 (ref 5.0–8.0)

## 2015-05-29 MED ORDER — LEVOFLOXACIN 500 MG PO TABS: 500.0000 mg | ORAL_TABLET | Freq: Every day | ORAL | Status: DC

## 2015-05-29 NOTE — Assessment & Plan Note (Signed)
Labs

## 2015-05-29 NOTE — Progress Notes (Signed)
   Subjective:    Fever  This is a new problem. The current episode started yesterday. The problem occurs constantly. The problem has been waxing and waning. The maximum temperature noted was 103 to 103.9 F. He has tried acetaminophen for the symptoms. The treatment provided mild relief.     F/u on GERD   NAD Review of Systems  Constitutional: Negative for appetite change, fatigue and unexpected weight change.  HENT: Negative for nosebleeds and trouble swallowing.   Eyes: Negative for itching and visual disturbance.  Cardiovascular: Negative for palpitations and leg swelling.  Gastrointestinal: Negative for blood in stool and abdominal distention.  Musculoskeletal: Negative for joint swelling and gait problem.  Neurological: Negative for tremors and speech difficulty.  Psychiatric/Behavioral: Negative for suicidal ideas, sleep disturbance, dysphoric mood and agitation. The patient is not nervous/anxious.        Objective:   Physical Exam  Constitutional: He is oriented to person, place, and time. He appears well-developed and well-nourished. No distress.  HENT:  Head: Normocephalic and atraumatic.  Right Ear: External ear normal.  Left Ear: External ear normal.  Nose: Nose normal.  Mouth/Throat: Oropharynx is clear and moist. No oropharyngeal exudate.  Eyes: Conjunctivae and EOM are normal. Pupils are equal, round, and reactive to light. Right eye exhibits no discharge. Left eye exhibits no discharge. No scleral icterus.  Neck: Normal range of motion. Neck supple. No JVD present. No tracheal deviation present. No thyromegaly present.  Cardiovascular: Normal rate, regular rhythm, normal heart sounds and intact distal pulses.  Exam reveals no gallop and no friction rub.   No murmur heard. Pulmonary/Chest: Effort normal and breath sounds normal. No stridor. No respiratory distress. He has no wheezes. He has no rales. He exhibits no tenderness.  Abdominal: Soft. Bowel sounds are  normal. He exhibits no distension and no mass. There is no tenderness. There is no rebound and no guarding.  Genitourinary: Rectum normal.  Musculoskeletal: Normal range of motion. He exhibits no edema or tenderness.  Lymphadenopathy:    He has no cervical adenopathy.  Neurological: He is alert and oriented to person, place, and time. He has normal reflexes. No cranial nerve deficit. He exhibits normal muscle tone. Coordination normal.  Skin: Skin is warm and dry. No rash noted. He is not diaphoretic. No erythema. No pallor.  Psychiatric: He has a normal mood and affect. His behavior is normal. Judgment and thought content normal.    Lab Results  Component Value Date   WBC 5.8 12/29/2014   HGB 15.5 12/29/2014   HCT 45.1 12/29/2014   PLT 192.0 12/29/2014   GLUCOSE 89 12/29/2014   CHOL 182 12/29/2014   TRIG 117.0 12/29/2014   HDL 58.90 12/29/2014   LDLCALC 100* 12/29/2014   ALT 19 12/29/2014   AST 22 12/29/2014   NA 138 12/29/2014   K 4.0 12/29/2014   CL 103 12/29/2014   CREATININE 1.24 12/29/2014   BUN 15 12/29/2014   CO2 27 12/29/2014   TSH 1.78 12/29/2014   PSA 1.49 12/29/2014   INR 2.2 RATIO* 07/15/2007   MICROALBUR 35.7 Repeated and verified X2.* 06/20/2013           Assessment & Plan:

## 2015-05-29 NOTE — Assessment & Plan Note (Signed)
likely pyelo vs prostatitis Levaquin Labs Abd [place]

## 2015-05-29 NOTE — Progress Notes (Signed)
Pre visit review using our clinic review tool, if applicable. No additional management support is needed unless otherwise documented below in the visit note. 

## 2015-05-31 ENCOUNTER — Encounter: Payer: Self-pay | Admitting: Internal Medicine

## 2015-05-31 LAB — CULTURE, URINE COMPREHENSIVE: Colony Count: >=100000

## 2015-06-14 ENCOUNTER — Encounter: Payer: Self-pay | Admitting: Internal Medicine

## 2015-06-20 ENCOUNTER — Other Ambulatory Visit: Payer: Self-pay | Admitting: Internal Medicine

## 2015-06-20 NOTE — Telephone Encounter (Signed)
Please advise, thanks.

## 2015-06-20 NOTE — Telephone Encounter (Signed)
Patient states that he is starting to re-manifest symptoms like before he was on levofloxacin (LEVAQUIN) 500 MG tablet [[id number]] . He states that he is not in any pain, but his urine has a pungent odor. I advised him that I would add this info onto this existing surescripts request

## 2015-06-22 NOTE — Telephone Encounter (Signed)
Called left vm ..

## 2016-04-14 ENCOUNTER — Telehealth: Payer: Self-pay

## 2016-04-14 MED ORDER — MELOXICAM 15 MG PO TABS: 15.0000 mg | ORAL_TABLET | Freq: Every day | ORAL | Status: DC | PRN

## 2016-04-14 NOTE — Telephone Encounter (Signed)
Ok meloxicam 15 mg po qd prn - see Rx Thx
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
## 2016-04-15 ENCOUNTER — Ambulatory Visit: Payer: PRIVATE HEALTH INSURANCE | Admitting: Internal Medicine

## 2016-04-15 NOTE — Telephone Encounter (Signed)
Pt informed

## 2017-03-13 ENCOUNTER — Ambulatory Visit (INDEPENDENT_AMBULATORY_CARE_PROVIDER_SITE_OTHER): Payer: Medicare Other | Admitting: Internal Medicine

## 2017-03-13 ENCOUNTER — Encounter: Payer: Self-pay | Admitting: Internal Medicine

## 2017-03-13 VITALS — BP 102/70 | HR 73 | Temp 98.1°F | Resp 16 | Ht 71.0 in | Wt 205.0 lb

## 2017-03-13 DIAGNOSIS — Z23 Encounter for immunization: Secondary | ICD-10-CM | POA: Diagnosis not present

## 2017-03-13 DIAGNOSIS — E785 Hyperlipidemia, unspecified: Secondary | ICD-10-CM

## 2017-03-13 DIAGNOSIS — I82401 Acute embolism and thrombosis of unspecified deep veins of right lower extremity: Secondary | ICD-10-CM

## 2017-03-13 DIAGNOSIS — Z86718 Personal history of other venous thrombosis and embolism: Secondary | ICD-10-CM | POA: Diagnosis not present

## 2017-03-13 DIAGNOSIS — N320 Bladder-neck obstruction: Secondary | ICD-10-CM

## 2017-03-13 DIAGNOSIS — I2699 Other pulmonary embolism without acute cor pulmonale: Secondary | ICD-10-CM

## 2017-03-13 DIAGNOSIS — Z0000 Encounter for general adult medical examination without abnormal findings: Secondary | ICD-10-CM

## 2017-03-13 DIAGNOSIS — Z86711 Personal history of pulmonary embolism: Secondary | ICD-10-CM | POA: Diagnosis not present

## 2017-03-13 DIAGNOSIS — H9313 Tinnitus, bilateral: Secondary | ICD-10-CM

## 2017-03-13 DIAGNOSIS — H9319 Tinnitus, unspecified ear: Secondary | ICD-10-CM | POA: Insufficient documentation

## 2017-03-13 MED ORDER — VITAMIN D 1000 UNITS PO TABS: 1000.0000 [IU] | ORAL_TABLET | Freq: Every day | ORAL | 11 refills | Status: AC

## 2017-03-13 NOTE — Assessment & Plan Note (Signed)

## 2017-03-13 NOTE — Patient Instructions (Addendum)
Shingrix vaccine ArginMax -- L-arginine  MC well w/Jill in 12 mo
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
## 2017-03-13 NOTE — Progress Notes (Signed)
Pre visit review using our clinic review tool, if applicable. No additional management support is needed unless otherwise documented below in the visit note. 
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
## 2017-03-13 NOTE — Assessment & Plan Note (Addendum)
Scratchy sounds Irrigate ears - hair clippings present

## 2017-03-24 ENCOUNTER — Other Ambulatory Visit (INDEPENDENT_AMBULATORY_CARE_PROVIDER_SITE_OTHER): Payer: Medicare Other

## 2017-03-24 DIAGNOSIS — Z0000 Encounter for general adult medical examination without abnormal findings: Secondary | ICD-10-CM | POA: Diagnosis not present

## 2017-03-24 DIAGNOSIS — N320 Bladder-neck obstruction: Secondary | ICD-10-CM

## 2017-03-24 DIAGNOSIS — H9313 Tinnitus, bilateral: Secondary | ICD-10-CM | POA: Diagnosis not present

## 2017-03-24 DIAGNOSIS — E785 Hyperlipidemia, unspecified: Secondary | ICD-10-CM

## 2017-03-24 LAB — CBC WITH DIFFERENTIAL/PLATELET
BASOS PCT: 0.7 % (ref 0.0–3.0)
Basophils Absolute: 0 10*3/uL (ref 0.0–0.1)
EOS PCT: 2.5 % (ref 0.0–5.0)
Eosinophils Absolute: 0.2 10*3/uL (ref 0.0–0.7)
HEMATOCRIT: 45 % (ref 39.0–52.0)
Hemoglobin: 15.2 g/dL (ref 13.0–17.0)
LYMPHS PCT: 45.2 % (ref 12.0–46.0)
Lymphs Abs: 2.8 10*3/uL (ref 0.7–4.0)
MCHC: 33.8 g/dL (ref 30.0–36.0)
MCV: 91.9 fl (ref 78.0–100.0)
MONO ABS: 0.7 10*3/uL (ref 0.1–1.0)
MONOS PCT: 11.4 % (ref 3.0–12.0)
Neutro Abs: 2.5 10*3/uL (ref 1.4–7.7)
Neutrophils Relative %: 40.2 % — ABNORMAL LOW (ref 43.0–77.0)
Platelets: 216 10*3/uL (ref 150.0–400.0)
RBC: 4.89 Mil/uL (ref 4.22–5.81)
RDW: 13.7 % (ref 11.5–15.5)
WBC: 6.3 10*3/uL (ref 4.0–10.5)

## 2017-03-24 LAB — URINALYSIS, ROUTINE W REFLEX MICROSCOPIC
Bilirubin Urine: NEGATIVE
Ketones, ur: NEGATIVE
LEUKOCYTES UA: NEGATIVE
NITRITE: NEGATIVE
SPECIFIC GRAVITY, URINE: 1.015 (ref 1.000–1.030)
Total Protein, Urine: NEGATIVE
URINE GLUCOSE: NEGATIVE
Urobilinogen, UA: 0.2 (ref 0.0–1.0)
WBC UA: NONE SEEN (ref 0–?)
pH: 7.5 (ref 5.0–8.0)

## 2017-03-24 LAB — HEPATIC FUNCTION PANEL
ALBUMIN: 4.2 g/dL (ref 3.5–5.2)
ALT: 14 U/L (ref 0–53)
AST: 19 U/L (ref 0–37)
Alkaline Phosphatase: 64 U/L (ref 39–117)
BILIRUBIN TOTAL: 0.7 mg/dL (ref 0.2–1.2)
Bilirubin, Direct: 0.2 mg/dL (ref 0.0–0.3)
TOTAL PROTEIN: 6.7 g/dL (ref 6.0–8.3)

## 2017-03-24 LAB — BASIC METABOLIC PANEL
BUN: 13 mg/dL (ref 6–23)
CHLORIDE: 106 meq/L (ref 96–112)
CO2: 29 mEq/L (ref 19–32)
Calcium: 9.6 mg/dL (ref 8.4–10.5)
Creatinine, Ser: 1.21 mg/dL (ref 0.40–1.50)
GFR: 77.24 mL/min (ref >60.00)
Glucose, Bld: 90 mg/dL (ref 70–99)
POTASSIUM: 4 meq/L (ref 3.5–5.1)
SODIUM: 140 meq/L (ref 135–145)

## 2017-03-24 LAB — LIPID PANEL
CHOL/HDL RATIO: 3
Cholesterol: 169 mg/dL (ref 0–200)
HDL: 52.3 mg/dL (ref >39.00)
LDL CALC: 102 mg/dL — AB (ref 0–99)
NonHDL: 116.77
TRIGLYCERIDES: 75 mg/dL (ref 0.0–149.0)
VLDL: 15 mg/dL (ref 0.0–40.0)

## 2017-03-24 LAB — TSH: TSH: 1.93 u[IU]/mL (ref 0.35–4.50)

## 2017-03-24 LAB — PSA: PSA: 1.51 ng/mL (ref 0.10–4.00)

## 2018-01-29 ENCOUNTER — Encounter: Payer: Self-pay | Admitting: Family

## 2018-01-29 ENCOUNTER — Ambulatory Visit (INDEPENDENT_AMBULATORY_CARE_PROVIDER_SITE_OTHER): Payer: Medicare HMO | Admitting: Family

## 2018-01-29 ENCOUNTER — Other Ambulatory Visit: Payer: Medicare HMO

## 2018-01-29 ENCOUNTER — Other Ambulatory Visit (INDEPENDENT_AMBULATORY_CARE_PROVIDER_SITE_OTHER): Payer: Medicare HMO

## 2018-01-29 ENCOUNTER — Ambulatory Visit (INDEPENDENT_AMBULATORY_CARE_PROVIDER_SITE_OTHER)
Admission: RE | Admit: 2018-01-29 | Discharge: 2018-01-29 | Disposition: A | Discharge status: Home / Self Care | Payer: Medicare HMO | Source: Ambulatory Visit | Attending: Family | Admitting: Family

## 2018-01-29 VITALS — BP 126/82 | HR 94 | Temp 99.1°F | Ht 71.0 in | Wt 206.0 lb

## 2018-01-29 DIAGNOSIS — M25562 Pain in left knee: Secondary | ICD-10-CM

## 2018-01-29 DIAGNOSIS — R300 Dysuria: Secondary | ICD-10-CM | POA: Diagnosis not present

## 2018-01-29 DIAGNOSIS — M1712 Unilateral primary osteoarthritis, left knee: Secondary | ICD-10-CM | POA: Diagnosis not present

## 2018-01-29 LAB — CBC WITH DIFFERENTIAL/PLATELET
BASOS PCT: 0.4 % (ref 0.0–3.0)
Basophils Absolute: 0.1 10*3/uL (ref 0.0–0.1)
EOS ABS: 0 10*3/uL (ref 0.0–0.7)
Eosinophils Relative: 0.1 % (ref 0.0–5.0)
HEMATOCRIT: 48 % (ref 39.0–52.0)
Hemoglobin: 16.2 g/dL (ref 13.0–17.0)
LYMPHS ABS: 2.1 10*3/uL (ref 0.7–4.0)
LYMPHS PCT: 12.3 % (ref 12.0–46.0)
MCHC: 33.8 g/dL (ref 30.0–36.0)
MCV: 94.1 fl (ref 78.0–100.0)
Monocytes Absolute: 1.6 10*3/uL — ABNORMAL HIGH (ref 0.1–1.0)
Monocytes Relative: 9.4 % (ref 3.0–12.0)
NEUTROS ABS: 13.4 10*3/uL — AB (ref 1.4–7.7)
NEUTROS PCT: 77.8 % — AB (ref 43.0–77.0)
PLATELETS: 204 10*3/uL (ref 150.0–400.0)
RBC: 5.11 Mil/uL (ref 4.22–5.81)
RDW: 13.2 % (ref 11.5–15.5)
WBC: 17.3 10*3/uL — ABNORMAL HIGH (ref 4.0–10.5)

## 2018-01-29 LAB — COMPREHENSIVE METABOLIC PANEL
ALT: 14 U/L (ref 0–53)
AST: 19 U/L (ref 0–37)
Albumin: 4.4 g/dL (ref 3.5–5.2)
Alkaline Phosphatase: 59 U/L (ref 39–117)
BUN: 18 mg/dL (ref 6–23)
CALCIUM: 10.2 mg/dL (ref 8.4–10.5)
CHLORIDE: 97 meq/L (ref 96–112)
CO2: 27 meq/L (ref 19–32)
CREATININE: 1.39 mg/dL (ref 0.40–1.50)
GFR: 65.64 mL/min (ref >60.00)
Glucose, Bld: 95 mg/dL (ref 70–99)
Potassium: 4.2 mEq/L (ref 3.5–5.1)
Sodium: 135 mEq/L (ref 135–145)
Total Bilirubin: 1.2 mg/dL (ref 0.2–1.2)
Total Protein: 8.1 g/dL (ref 6.0–8.3)

## 2018-01-29 LAB — POC URINALSYSI DIPSTICK (AUTOMATED)
BILIRUBIN UA: NEGATIVE
Blood, UA: 3
Glucose, UA: NEGATIVE
KETONES UA: NEGATIVE
NITRITE UA: NEGATIVE
Protein, UA: 15
Urobilinogen, UA: 0.2 E.U./dL
pH, UA: 5.5 (ref 5.0–8.0)

## 2018-01-29 LAB — PSA: PSA: 11.06 ng/mL — ABNORMAL HIGH (ref 0.10–4.00)

## 2018-01-29 MED ORDER — CIPROFLOXACIN HCL 500 MG PO TABS
500.0000 mg | ORAL_TABLET | Freq: Two times a day (BID) | ORAL | 0 refills | Status: AC
Start: 2018-01-29 — End: 2018-02-12

## 2018-01-29 MED ORDER — CEFTRIAXONE SODIUM 500 MG IJ SOLR
500.0000 mg | Freq: Once | INTRAMUSCULAR | Status: AC
  Administered 2018-01-29: 500 mg via INTRAMUSCULAR

## 2018-01-29 NOTE — Progress Notes (Signed)
Jorge Rogers is a 67 y.o. male with the following history as recorded in Epic[employer]:  Patient Active Problem List   Diagnosis Date Noted  . Tinnitus 03/13/2017  . Lower back pain 12/08/2014  . UTI (urinary tract infection) 06/21/2013  . Fever and chills 06/21/2013  . Wart viral 12/26/2012  . Groin pain, right lower quadrant 10/04/2012  . Well adult exam 08/15/2011  . Microhematuria 08/15/2011  . Pulmonary embolism ([place]) 07/06/2007  . 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 aspirin 81 MG tablet Take 81 mg by mouth daily.      . cholecalciferol (VITAMIN D) 1000 units tablet Take 1 tablet (1,000 Units total) by mouth daily. 30 tablet 11  . omeprazole (PRILOSEC) 20 MG capsule Take 20 mg by mouth daily.      . ciprofloxacin (CIPRO) 500 MG tablet Take 1 tablet (500 mg total) by mouth 2 (two) times daily for 14 days. 28 tablet 0   No current facility-administered medications for this visit.     Allergies: Patient has no known allergies.  Past Medical History:  Diagnosis Date  . Pulmonary embolism ([place]) 07/2007   B PE and RLE DVT    Past Surgical History:  Procedure Laterality Date  . ACHILLES TENDON REPAIR      Family History  Problem Relation Age of Onset  . Heart disease Father 34       card arrest  . Colon cancer Neg Hx     Social History   Tobacco Use  . Smoking status: Never Smoker  . Smokeless tobacco: Never Used  Substance Use Topics  . Alcohol use: Yes    Subjective:  Started with possible UTI 2 days ago; complaining of urinary frequency/ "strong smelling urine"/ fever; + body aches; denies any blood in urine; denies any flank pain; no known history of prostatitis; Also mentions concerns for increased pain in his left knee; feels that pain is localized on inner knee and behind the knee; no known injury or trauma; no swelling; no history of gout;     Objective:   Vitals:   01/29/18 1330  BP: 126/82  Pulse: 94  Temp: 99.1 F (37.3 C)  TempSrc: Oral  SpO2: 99%  Weight: 206 lb (93.4 kg)  Height: '5\' 11"'$  (1.803 m)    General: Well developed, well nourished, in no acute distress  Skin : Warm and dry.  Lungs: Respirations unlabored; clear to auscultation bilaterally without wheeze, rales, rhonchi  CVS exam: normal rate and regular rhythm.  Neurologic: Alert and oriented; speech intact; face symmetrical; moves all extremities well; CNII-XII intact without focal deficit  Assessment:  1. Dysuria   2. Acute pain of left knee     Plan:  1. Suspect prostatitis; check U/A, PSA, CBC today; Rocephin IM 500 mg given in office today; start Cipro 500 mg bid x 14 days tomorrow; follow-up to be determined; 2. Update X-ray of knee- follow-up to be determined.  No Follow-up on file.  Orders Placed This Encounter  Procedures  . Urine Culture    Standing Status:   Future    Number of Occurrences:   1    Standing Expiration Date:   01/29/2019  . DG Knee Complete 4 Views Left    Standing Status:   Future    Number of Occurrences:   1    Standing Expiration Date:   04/01/2019  Order Specific Question:   Reason for Exam (SYMPTOM  OR DIAGNOSIS REQUIRED)    Answer:   knee pain    Order Specific Question:   Preferred imaging location?    Answer:   [facility]    Order Specific Question:   Radiology Contrast Protocol - do NOT remove file path    Answer:   [email or web address]  . CBC w/Diff    Standing Status:   Future    Number of Occurrences:   1    Standing Expiration Date:   01/29/2019  . PSA    Standing Status:   Future    Number of Occurrences:   1    Standing Expiration Date:   01/29/2019  . Comp Met (CMET)    Standing Status:   Future    Number of Occurrences:   1    Standing Expiration Date:   01/29/2019  . POCT Urinalysis Dipstick (Automated)    Requested Prescriptions   Signed Prescriptions Disp  Refills  . ciprofloxacin (CIPRO) 500 MG tablet 28 tablet 0    Sig: Take 1 tablet (500 mg total) by mouth 2 (two) times daily for 14 days.

## 2018-02-01 LAB — URINE CULTURE
MICRO NUMBER: 90268449
SPECIMEN QUALITY: ADEQUATE

## 2018-03-01 NOTE — Progress Notes (Signed)
[facility] [address] [place], [place] [address] Phone: [phone] Subjective:    I'm seeing this patient by the request  of:  Plotnikov, Evie Lacks, MD   CC: knee pain   [id number]  Jorge Rogers is a 67 y.o. male coming in with complaint of left knee pain. He likes to exercise and this causes pain. He plays tennis and golf but would like to get back to running. Patient has had pain since January 2019. Most of his pain is on the posterior medial aspect of knee. Pain is sharp and intermittent. Patient notes feeling pain when he is lying on his side and his knees are touching one another.   Onset- 2 months  Location- medial left knee Duration- intermittent Character- sharp Aggravating factors- running Reliving factors- not being as active Therapies tried-  Severity-   Left knee xrays 01/29/18 Degenerative changes at medial compartment LEFT knee.  Past Medical History:  Diagnosis Date  . Pulmonary embolism ([facility]) 07/2007   B PE and RLE DVT   Past Surgical History:  Procedure Laterality Date  . ACHILLES TENDON REPAIR     Social History   Socioeconomic History  . Marital status: Married    Spouse name: Not on file  . Number of children: Not on file  . Years of education: Not on file  . Highest education level: Not on file  Occupational History  . Not on file  Social Needs  . Financial resource strain: Not on file  . Food insecurity:    Worry: Not on file    Inability: Not on file  . Transportation needs:    Medical: Not on file    Non-medical: Not on file  Tobacco Use  . Smoking status: Never Smoker  . Smokeless tobacco: Never Used  Substance and Sexual Activity  . Alcohol use: Yes  . Drug use: No  . Sexual activity: Yes  Lifestyle  . Physical activity:    Days per week: Not on file    Minutes per session: Not on file  . Stress: Not on file  Relationships  . Social connections:    Talks on phone: Not on file    Gets  together: Not on file    Attends religious service: Not on file    Active member of club or organization: Not on file    Attends meetings of clubs or organizations: Not on file    Relationship status: Not on file  Other Topics Concern  . Not on file  Social History Narrative  . Not on file   No Known Allergies Family History  Problem Relation Age of Onset  . Heart disease Father 41       card arrest  . Colon cancer Neg Hx      Past medical history, social, surgical and family history all reviewed in electronic medical record.  No pertanent information unless stated regarding to the chief complaint.   Review of Systems:Review of systems updated and as accurate as of 03/02/18  No headache, visual changes, nausea, vomiting, diarrhea, constipation, dizziness, abdominal pain, skin rash, fevers, chills, night sweats, weight loss, swollen lymph nodes, body aches, joint swelling, muscle aches, chest pain, shortness of breath, mood changes.   Objective  Blood pressure 110/84, pulse 71, weight 207 lb (93.9 kg), SpO2 98 %. Systems examined below as of 03/02/18   General: No apparent distress alert and oriented x3 mood and affect normal, dressed appropriately.  HEENT:  Pupils equal, extraocular movements intact  Respiratory: Patient's speak in full sentences and does not appear short of breath  Cardiovascular: No lower extremity edema, non tender, no erythema  Skin: Warm dry intact with no signs of infection or rash on extremities or on axial skeleton.  Abdomen: Soft nontender  Neuro: Cranial nerves II through XII are intact, neurovascularly intact in all extremities with 2+ DTRs and 2+ pulses.  Lymph: No lymphadenopathy of posterior or anterior cervical chain or axillae bilaterally.  Gait abnormality of gait antalgic MSK:  Non tender with full range of motion and good stability and symmetric strength and tone of shoulders, elbows, wrist, hip, and ankles bilaterally.  Knee: Left valgus  deformity noted.  Abnormal thigh to calf ratio.  Effusion noted Tender to palpation over medial and PF joint line.  ROM full in flexion and extension and lower leg rotation. instability with valgus force.  painful patellar compression. Patellar glide with moderate crepitus. Patellar and quadriceps tendons unremarkable. Hamstring and quadriceps strength is normal. Contralateral knee shows minimal arthritic changes  Procedure: Real-time Ultrasound Guided Injection of left knee Device: GE Logiq Q7 Ultrasound guided injection is preferred based studies that show increased duration, increased effect, greater accuracy, decreased procedural pain, increased response rate, and decreased cost with ultrasound guided versus blind injection.  Verbal informed consent obtained.  Time-out conducted.  Noted no overlying erythema, induration, or other signs of local infection.  Skin prepped in a sterile fashion.  Local anesthesia: Topical Ethyl chloride.  With sterile technique and under real time ultrasound guidance: With a 22-gauge 2 inch needle patient was injected with 4 cc of 0.5% Marcaine and aspirated 32 cc of strawlike color fluid then injected  1 cc of Kenalog 40 mg/dL. This was from a superior lateral approach.  Completed without difficulty  Pain immediately resolved suggesting accurate placement of the medication.  Advised to call if fevers/chills, erythema, induration, drainage, or persistent bleeding.  Images permanently stored and available for review in the ultrasound unit.  Impression: Technically successful ultrasound guided injection.     Impression and Recommendations:     This case required medical decision making of moderate complexity.      Note: This dictation was prepared with Dragon dictation along with smaller phrase technology. Any transcriptional errors that result from this process are unintentional.

## 2018-03-02 ENCOUNTER — Ambulatory Visit: Payer: Medicare HMO | Admitting: Family Medicine

## 2018-03-02 ENCOUNTER — Ambulatory Visit: Payer: Self-pay

## 2018-03-02 ENCOUNTER — Other Ambulatory Visit: Payer: Medicare HMO

## 2018-03-02 ENCOUNTER — Encounter: Payer: Self-pay | Admitting: Family Medicine

## 2018-03-02 VITALS — BP 110/84 | HR 71 | Wt 207.0 lb

## 2018-03-02 DIAGNOSIS — M25562 Pain in left knee: Secondary | ICD-10-CM

## 2018-03-02 DIAGNOSIS — G8929 Other chronic pain: Secondary | ICD-10-CM | POA: Diagnosis not present

## 2018-03-02 DIAGNOSIS — M1712 Unilateral primary osteoarthritis, left knee: Secondary | ICD-10-CM | POA: Diagnosis not present

## 2018-03-02 NOTE — Assessment & Plan Note (Signed)
Patient did have aspiration done.  Discussed icing regimen, home exercises, and home over-the-counter medications.  Given injection today.  Patient does have an abnormal thigh to calf ratio and will get a medial unloader brace that will be customized.  Patient will see me again in 4 weeks.  Could be a candidate for Visco supplementation

## 2018-03-02 NOTE — Patient Instructions (Signed)
Good to see you.  Ice 20 minutes 2 times daily. Usually after activity and before bed. Exercises 3 times a week.  We will get you a custom brace Drained the knee today  Over the counter get  Vitamin D 2000 IU daily  Turmeric 500mg  daily  Tart cherry extract 1200mg  at night Biking swimming or elliptical would be better See me again in 4 weeks

## 2018-03-03 LAB — SYNOVIAL CELL COUNT + DIFF, W/ CRYSTALS
BASOPHILS, %: 0 %
Eosinophils-Synovial: 0 % (ref 0–2)
LYMPHOCYTES-SYNOVIAL FLD: 53 % (ref 0–74)
Monocyte/Macrophage: 16 % (ref 0–69)
NEUTROPHIL, SYNOVIAL: 31 % — AB (ref 0–24)
Synoviocytes, %: 0 % (ref 0–15)
WBC, Synovial: 182 cells/uL — ABNORMAL HIGH (ref <150)

## 2018-03-03 LAB — TIQ-NTM

## 2018-04-01 ENCOUNTER — Ambulatory Visit: Payer: Medicare HMO | Admitting: Family Medicine

## 2018-04-01 ENCOUNTER — Encounter: Payer: Self-pay | Admitting: Family Medicine

## 2018-04-01 DIAGNOSIS — M1712 Unilateral primary osteoarthritis, left knee: Secondary | ICD-10-CM | POA: Diagnosis not present

## 2018-04-01 NOTE — Progress Notes (Signed)
[facility] [address] [place], [place] [address] Phone: [phone] Subjective:     CC: Knee pain follow-up  [id number]  Jorge Rogers is a 67 y.o. male coming in with complaint of left knee pain.  Found to have arthritic changes.  Was given injection March 02, 2018.  Patient states that he is feeling 100% better at this time.  Wants to be running on a more regular basis.  Patient was supposed to get a stability brace but has not heard at this time.     Past Medical History:  Diagnosis Date  . Pulmonary embolism ([facility]) 07/2007   B PE and RLE DVT   Past Surgical History:  Procedure Laterality Date  . ACHILLES TENDON REPAIR     Social History   Socioeconomic History  . Marital status: Married    Spouse name: Not on file  . Number of children: Not on file  . Years of education: Not on file  . Highest education level: Not on file  Occupational History  . Not on file  Social Needs  . Financial resource strain: Not on file  . Food insecurity:    Worry: Not on file    Inability: Not on file  . Transportation needs:    Medical: Not on file    Non-medical: Not on file  Tobacco Use  . Smoking status: Never Smoker  . Smokeless tobacco: Never Used  Substance and Sexual Activity  . Alcohol use: Yes  . Drug use: No  . Sexual activity: Yes  Lifestyle  . Physical activity:    Days per week: Not on file    Minutes per session: Not on file  . Stress: Not on file  Relationships  . Social connections:    Talks on phone: Not on file    Gets together: Not on file    Attends religious service: Not on file    Active member of club or organization: Not on file    Attends meetings of clubs or organizations: Not on file    Relationship status: Not on file  Other Topics Concern  . Not on file  Social History Narrative  . Not on file   No Known Allergies Family History  Problem Relation Age of Onset  . Heart disease Father 13       card arrest   . Colon cancer Neg Hx      Past medical history, social, surgical and family history all reviewed in electronic medical record.  No pertanent information unless stated regarding to the chief complaint.   Review of Systems:Review of systems updated and as accurate as of 04/01/18  No headache, visual changes, nausea, vomiting, diarrhea, constipation, dizziness, abdominal pain, skin rash, fevers, chills, night sweats, weight loss, swollen lymph nodes, body aches, joint swelling, muscle aches, chest pain, shortness of breath, mood changes.  Positive muscle aches  Objective  Blood pressure 130/90, pulse 71, height 5\' 11"  (1.803 m), weight 203 lb (92.1 kg), SpO2 98 %. Systems examined below as of 04/01/18   General: No apparent distress alert and oriented x3 mood and affect normal, dressed appropriately.  HEENT: Pupils equal, extraocular movements intact  Respiratory: Patient's speak in full sentences and does not appear short of breath  Cardiovascular: No lower extremity edema, non tender, no erythema  Skin: Warm dry intact with no signs of infection or rash on extremities or on axial skeleton.  Abdomen: Soft nontender  Neuro: Cranial nerves II  through XII are intact, neurovascularly intact in all extremities with 2+ DTRs and 2+ pulses.  Lymph: No lymphadenopathy of posterior or anterior cervical chain or axillae bilaterally.  Gait normal with good balance and coordination.  MSK:  Non tender with full range of motion and good stability and symmetric strength and tone of shoulders, elbows, wrist, hip, and ankles bilaterally.  Left knee has no effusion.  Patient still has pain no over the medial aspect of the knee.  Mild instability noted.  Full range of motion.  Improvement from previous exam with 5 out of 5 strength.    Impression and Recommendations:     This case required medical decision making of moderate complexity.      Note: This dictation was prepared with Dragon dictation  along with smaller phrase technology. Any transcriptional errors that result from this process are unintentional.

## 2018-04-01 NOTE — Assessment & Plan Note (Signed)
Improved after the injection.  Patient has no swelling this time.  Discussed icing regimen and home exercises.  Patient will start to increase activity as tolerated.  Patient will follow-up with me again to 8 weeks if any worsening pain.

## 2018-04-01 NOTE — Patient Instructions (Signed)
Good to see you  We will check on the brace Ice is yoru friend Keep up with the vitamins Stay active See me again in 6-8 weeks if not 100%

## 2018-05-24 NOTE — Progress Notes (Signed)
[facility] [address] [place], [place] [address] Phone: [phone] Subjective:     CC: Left knee  [id number]  DOMINGO FUSON is a 67 y.o. male coming in with complaint of left knee pain.  Severe arthritis of the knee.  Given injection 10 weeks ago.  Patient states was doing very well but is having swelling again for the last couple weeks.  Seems to be the same area where given pain on the medial aspect.  Did not get the medial unloader brace previously.     Past Medical History:  Diagnosis Date  . Pulmonary embolism ([facility]) 07/2007   B PE and RLE DVT   Past Surgical History:  Procedure Laterality Date  . ACHILLES TENDON REPAIR     Social History   Socioeconomic History  . Marital status: Married    Spouse name: Not on file  . Number of children: Not on file  . Years of education: Not on file  . Highest education level: Not on file  Occupational History  . Not on file  Social Needs  . Financial resource strain: Not on file  . Food insecurity:    Worry: Not on file    Inability: Not on file  . Transportation needs:    Medical: Not on file    Non-medical: Not on file  Tobacco Use  . Smoking status: Never Smoker  . Smokeless tobacco: Never Used  Substance and Sexual Activity  . Alcohol use: Yes  . Drug use: No  . Sexual activity: Yes  Lifestyle  . Physical activity:    Days per week: Not on file    Minutes per session: Not on file  . Stress: Not on file  Relationships  . Social connections:    Talks on phone: Not on file    Gets together: Not on file    Attends religious service: Not on file    Active member of club or organization: Not on file    Attends meetings of clubs or organizations: Not on file    Relationship status: Not on file  Other Topics Concern  . Not on file  Social History Narrative  . Not on file   No Known Allergies Family History  Problem Relation Age of Onset  . Heart disease Father 68       card  arrest  . Colon cancer Neg Hx      Past medical history, social, surgical and family history all reviewed in electronic medical record.  No pertanent information unless stated regarding to the chief complaint.   Review of Systems:Review of systems updated and as accurate as of 05/25/18  No headache, visual changes, nausea, vomiting, diarrhea, constipation, dizziness, abdominal pain, skin rash, fevers, chills, night sweats, weight loss, swollen lymph nodes, body aches,  chest pain, shortness of breath, mood changes.  Positive muscle aches and joint swelling  Objective  Blood pressure 102/62, pulse 68, height 5\' 11"  (1.803 m), weight 201 lb (91.2 kg), SpO2 98 %. Systems examined below as of 05/25/18   General: No apparent distress alert and oriented x3 mood and affect normal, dressed appropriately.  HEENT: Pupils equal, extraocular movements intact  Respiratory: Patient's speak in full sentences and does not appear short of breath  Cardiovascular: No lower extremity edema, non tender, no erythema  Skin: Warm dry intact with no signs of infection or rash on extremities or on axial skeleton.  Abdomen: Soft nontender  Neuro: Cranial nerves II  through XII are intact, neurovascularly intact in all extremities with 2+ DTRs and 2+ pulses.  Lymph: No lymphadenopathy of posterior or anterior cervical chain or axillae bilaterally.  Gait normal with good balance and coordination.  MSK:  Non tender with full range of motion and good stability and symmetric strength and tone of shoulders, elbows, wrist, hip, and ankles bilaterally.  Knee: Left valgus deformity noted. Large thigh to calf ratio.  Effusion noted Tender to palpation over medial and PF joint line.  ROM full in flexion and extension and lower leg rotation. instability with valgus force.  painful patellar compression. Patellar glide with moderate crepitus. Patellar and quadriceps tendons unremarkable. Hamstring and quadriceps strength is  normal. Contralateral knee shows minimal arthritic changes  Procedure: Real-time Ultrasound Guided Injection of left knee Device: GE Logiq Q7 Ultrasound guided injection is preferred based studies that show increased duration, increased effect, greater accuracy, decreased procedural pain, increased response rate, and decreased cost with ultrasound guided versus blind injection.  Verbal informed consent obtained.  Time-out conducted.  Noted no overlying erythema, induration, or other signs of local infection.  Skin prepped in a sterile fashion.  Local anesthesia: Topical Ethyl chloride.  With sterile technique and under real time ultrasound guidance: With a 22-gauge 2 inch needle patient was injected with 4 cc of 0.5% Marcaine and aspirated 50 cc of strawlike color fluid then injected 1 cc of Kenalog 40 mg/dL. This was from a superior lateral approach.  Completed without difficulty  Pain immediately resolved suggesting accurate placement of the medication.  Advised to call if fevers/chills, erythema, induration, drainage, or persistent bleeding.  Images permanently stored and available for review in the ultrasound unit.  Impression: Technically successful ultrasound guided injection.     Impression and Recommendations:     This case required medical decision making of moderate complexity.      Note: This dictation was prepared with Dragon dictation along with smaller phrase technology. Any transcriptional errors that result from this process are unintentional.

## 2018-05-25 ENCOUNTER — Encounter: Payer: Self-pay | Admitting: Family Medicine

## 2018-05-25 ENCOUNTER — Ambulatory Visit: Payer: Self-pay

## 2018-05-25 ENCOUNTER — Ambulatory Visit (INDEPENDENT_AMBULATORY_CARE_PROVIDER_SITE_OTHER): Payer: Medicare HMO | Admitting: Family Medicine

## 2018-05-25 VITALS — BP 102/62 | HR 68 | Ht 71.0 in | Wt 201.0 lb

## 2018-05-25 DIAGNOSIS — M1712 Unilateral primary osteoarthritis, left knee: Secondary | ICD-10-CM | POA: Diagnosis not present

## 2018-05-25 DIAGNOSIS — M25562 Pain in left knee: Secondary | ICD-10-CM

## 2018-05-25 DIAGNOSIS — G8929 Other chronic pain: Secondary | ICD-10-CM | POA: Diagnosis not present

## 2018-05-25 NOTE — Patient Instructions (Signed)
Good to see you  Jorge Rogers is your friend.  aspirated the knee today  Will try to get the brace and get approval for other injections  Continue everything else  See me again in 6 weeks

## 2018-05-25 NOTE — Assessment & Plan Note (Signed)
Patient given an injection.  Tolerated procedure well.  50 cc also drained.  Patient could be a candidate for Visco supplementation.  Also fitted for a medial unloader brace today that I think will be beneficial.  Patient does have an abnormal thigh to calf ratio that could be contributing Patient needs a custom brace.  Patient will follow-up with me again 4 weeks.

## 2018-06-03 DIAGNOSIS — M1712 Unilateral primary osteoarthritis, left knee: Secondary | ICD-10-CM | POA: Diagnosis not present

## 2018-10-27 DIAGNOSIS — R509 Fever, unspecified: Secondary | ICD-10-CM | POA: Diagnosis not present

## 2018-10-27 DIAGNOSIS — N3001 Acute cystitis with hematuria: Secondary | ICD-10-CM | POA: Diagnosis not present

## 2018-10-27 DIAGNOSIS — M791 Myalgia, unspecified site: Secondary | ICD-10-CM | POA: Diagnosis not present

## 2018-11-02 DIAGNOSIS — H524 Presbyopia: Secondary | ICD-10-CM | POA: Diagnosis not present

## 2018-11-13 DIAGNOSIS — H524 Presbyopia: Secondary | ICD-10-CM | POA: Diagnosis not present

## 2018-11-13 DIAGNOSIS — H52223 Regular astigmatism, bilateral: Secondary | ICD-10-CM | POA: Diagnosis not present

## 2018-11-18 ENCOUNTER — Ambulatory Visit (INDEPENDENT_AMBULATORY_CARE_PROVIDER_SITE_OTHER): Payer: Medicare HMO | Admitting: Nurse Practitioner

## 2018-11-18 ENCOUNTER — Other Ambulatory Visit: Payer: Medicare HMO

## 2018-11-18 ENCOUNTER — Ambulatory Visit: Payer: Self-pay

## 2018-11-18 ENCOUNTER — Other Ambulatory Visit (INDEPENDENT_AMBULATORY_CARE_PROVIDER_SITE_OTHER): Payer: Medicare HMO

## 2018-11-18 ENCOUNTER — Encounter: Payer: Self-pay | Admitting: Nurse Practitioner

## 2018-11-18 VITALS — BP 114/74 | HR 86 | Temp 98.2°F | Ht 71.0 in | Wt 212.0 lb

## 2018-11-18 DIAGNOSIS — N390 Urinary tract infection, site not specified: Secondary | ICD-10-CM | POA: Diagnosis not present

## 2018-11-18 DIAGNOSIS — R300 Dysuria: Secondary | ICD-10-CM

## 2018-11-18 DIAGNOSIS — R319 Hematuria, unspecified: Secondary | ICD-10-CM

## 2018-11-18 LAB — CBC
HCT: 45.6 % (ref 39.0–52.0)
Hemoglobin: 15.6 g/dL (ref 13.0–17.0)
MCHC: 34.1 g/dL (ref 30.0–36.0)
MCV: 93.1 fl (ref 78.0–100.0)
Platelets: 234 10*3/uL (ref 150.0–400.0)
RBC: 4.9 Mil/uL (ref 4.22–5.81)
RDW: 13.9 % (ref 11.5–15.5)
WBC: 10.5 10*3/uL (ref 4.0–10.5)

## 2018-11-18 LAB — COMPREHENSIVE METABOLIC PANEL
ALK PHOS: 64 U/L (ref 39–117)
ALT: 14 U/L (ref 0–53)
AST: 15 U/L (ref 0–37)
Albumin: 4.4 g/dL (ref 3.5–5.2)
BUN: 13 mg/dL (ref 6–23)
CO2: 29 mEq/L (ref 19–32)
Calcium: 9.7 mg/dL (ref 8.4–10.5)
Chloride: 102 mEq/L (ref 96–112)
Creatinine, Ser: 1.27 mg/dL (ref 0.40–1.50)
GFR: 72.67 mL/min (ref >60.00)
Glucose, Bld: 71 mg/dL (ref 70–99)
POTASSIUM: 4 meq/L (ref 3.5–5.1)
Sodium: 139 mEq/L (ref 135–145)
Total Bilirubin: 0.5 mg/dL (ref 0.2–1.2)
Total Protein: 7.1 g/dL (ref 6.0–8.3)

## 2018-11-18 LAB — POCT URINALYSIS DIPSTICK
Glucose, UA: NEGATIVE
KETONES UA: NEGATIVE
Protein, UA: POSITIVE — AB
Urobilinogen, UA: 0.2 E.U./dL
pH, UA: 6 (ref 5.0–8.0)

## 2018-11-18 LAB — PSA: PSA: 4.53 ng/mL — ABNORMAL HIGH (ref 0.10–4.00)

## 2018-11-18 MED ORDER — CIPROFLOXACIN HCL 500 MG PO TABS: 500.0000 mg | ORAL_TABLET | Freq: Two times a day (BID) | ORAL | 0 refills | Status: DC

## 2018-11-18 NOTE — Progress Notes (Signed)
Jorge Rogers is a 67 y.o. male with the following history as recorded in [employer]:  Patient Active Problem List   Diagnosis Date Noted  . Degenerative arthritis of left knee 03/02/2018  . Tinnitus 03/13/2017  . Lower back pain 12/08/2014  . UTI (urinary tract infection) 06/21/2013  . Fever and chills 06/21/2013  . Wart viral 12/26/2012  . Groin pain, right lower quadrant 10/04/2012  . Well adult exam 08/15/2011  . Microhematuria 08/15/2011  . Pulmonary embolism ([facility]) 07/06/2007  . 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 aspirin 81 MG tablet Take 81 mg by mouth daily.      Marland Kitchen omeprazole (PRILOSEC) 20 MG capsule Take 20 mg by mouth daily.      . ciprofloxacin (CIPRO) 500 MG tablet Take 1 tablet (500 mg total) by mouth 2 (two) times daily. 14 tablet 0   No current facility-administered medications for this visit.     Allergies: Patient has no known allergies.  Past Medical History:  Diagnosis Date  . Pulmonary embolism ([facility]) 07/2007   B PE and RLE DVT    Past Surgical History:  Procedure Laterality Date  . ACHILLES TENDON REPAIR      Family History  Problem Relation Age of Onset  . Heart disease Father 15       card arrest  . Colon cancer Neg Hx     Social History   Tobacco Use  . Smoking status: Never Smoker  . Smokeless tobacco: Never Used  Substance Use Topics  . Alcohol use: Yes     Subjective:  Jorge Rogers is here today for evaluation of acute urinary symptoms, which first began this morning when he woke, he notice a feeling of urinary urgency, then burning with urination, again after breakfast felt sudden urge to urinate and was only able to void "a few drops, looks like they were tinted with blood." since this morning, he has continued to feel some urgency and hesitancy but says he feels well and has not noted any additional blood in urine. He Denies fevers, chills,  nausea, vomiting, abd pain, back pain.  He reports a history of UTI, had some cipro from his last infection and took one this morning. He would like to see a urologist due to repeated urinary infections.  ROS - See HPI  Objective:  Vitals:   11/18/18 1453  BP: 114/74  Pulse: 86  Temp: 98.2 F (36.8 C)  TempSrc: Oral  SpO2: 98%  Weight: 212 lb (96.2 kg)  Height: 5\' 11"  (1.803 m)    General: Well developed, well nourished, in no acute distress  Skin : Warm and dry.  Head: Normocephalic and atraumatic  Eyes: Sclera and conjunctiva clear; pupils round and reactive to light; extraocular movements intact  Oropharynx: Pink, supple. No suspicious lesions  Neck: Supple Lungs: Respirations unlabored; clear to auscultation bilaterally  CVS exam: normal rate and regular rhythm, S1 and S2 normal.  Abdomen: Soft; nontender; nondistended; normoactive bowel sounds; no masses or hepatosplenomegaly; no CVA tenderness Extremities: No edema, cyanosis Vessels: Symmetric bilaterally  Neurologic: Alert and oriented; speech intact; face symmetrical; moves all extremities well; CNII-XII intact without focal deficit  Psychiatric: Normal mood and affect.   Assessment:  1. Dysuria   2. Hematuria, unspecified type   3. Recurrent UTI     Plan:   POCT urinalysis today with 3+leuykocytes, blood. Will start antibiotic course  and order additional labs, urine culture for further evaluation. Medication dosing, side effects discussed. Agree with referral to urology and this was placed for him today F/U with further recommendations pending lab results Home management, red flags and return precautions including when to seek immediate care discussed and printed on AVS   No follow-ups on file.  Orders Placed This Encounter  Procedures  . Urine Culture    Standing Status:   Future    Standing Expiration Date:   12/19/2018  . CBC    Standing Status:   Future    Number of Occurrences:   1    Standing  Expiration Date:   11/19/2019  . Comprehensive metabolic panel    Standing Status:   Future    Number of Occurrences:   1    Standing Expiration Date:   11/19/2019  . PSA    Standing Status:   Future    Number of Occurrences:   1    Standing Expiration Date:   11/19/2019  . Ambulatory referral to Urology    Referral Priority:   Routine    Referral Type:   Consultation    Referral Reason:   Specialty Services Required    Requested Specialty:   Urology    Number of Visits Requested:   1  . POCT urinalysis dipstick    Requested Prescriptions   Signed Prescriptions Disp Refills  . ciprofloxacin (CIPRO) 500 MG tablet 14 tablet 0    Sig: Take 1 tablet (500 mg total) by mouth 2 (two) times daily.

## 2018-11-18 NOTE — Telephone Encounter (Signed)
Pt called with C/O burning in groin area when urinating. He states he also has seen some drips of blood.  He states urine appears normal. He rates the burning as 3.  He denies frequency. He states he has voided twice with these symptoms. He feels he is emptying his bladder. He denies back and flank pain. He has had no fever. Appointment scheduled per protocol. Care advice read to patient. Patient verbalized understanding of all instructions.  Reason for Disposition . Pain or burning with passing urine  Answer Assessment - Initial Assessment Questions 1. COLOR of URINE: "Describe the color of the urine."  (e.g., tea-colored, pink, red, blood clots, bloody)     yellow 2. ONSET: "When did the bleeding start?"      today 3. EPISODES: "How many times has there been blood in the urine?" or "How many times today?"     2times 4. PAIN with URINATION: "Is there any pain with passing your urine?" If so, ask: "How bad is the pain?"  (Scale 1-10; or mild, moderate, severe)    - MILD - complains slightly about urination hurting    - MODERATE - interferes with normal activities      - SEVERE - excruciating, unwilling or unable to urinate because of the pain      Burning 3 5. FEVER: "Do you have a fever?" If so, ask: "What is your temperature, how was it measured, and when did it start?"     no 6. ASSOCIATED SYMPTOMS: "Are you passing urine more frequently than usual?"     no 7. OTHER SYMPTOMS: "Do you have any other symptoms?" (e.g., back/flank pain, abdominal pain, vomiting)     no 8. PREGNANCY: "Is there any chance you are pregnant?" "When was your last menstrual period?"     N/A  Protocols used: URINE - BLOOD IN-A-AH

## 2018-11-18 NOTE — Patient Instructions (Signed)
Start cipro 500 twice daily  I have placed referral to urology  I will let you know when I get your labs back   Urinary Tract Infection, Adult A urinary tract infection (UTI) is an infection of any part of the urinary tract. The urinary tract includes:  The kidneys.  The ureters.  The bladder.  The urethra. These organs make, store, and get rid of pee (urine) in the body. What are the causes? This is caused by germs (bacteria) in your genital area. These germs grow and cause swelling (inflammation) of your urinary tract. What increases the risk? You are more likely to develop this condition if:  You have a small, thin tube (catheter) to drain pee.  You cannot control when you pee or poop (incontinence).  You are male, and: ? You use these methods to prevent pregnancy: ? A medicine that kills sperm (spermicide). ? A device that blocks sperm (diaphragm). ? You have low levels of a male hormone (estrogen). ? You are pregnant.  You have genes that add to your risk.  You are sexually active.  You take antibiotic medicines.  You have trouble peeing because of: ? A prostate that is bigger than normal, if you are male. ? A blockage in the part of your body that drains pee from the bladder (urethra). ? A kidney stone. ? A nerve condition that affects your bladder (neurogenic bladder). ? Not getting enough to drink. ? Not peeing often enough.  You have other conditions, such as: ? Diabetes. ? A weak disease-fighting system (immune system). ? Sickle cell disease. ? Gout. ? Injury of the spine. What are the signs or symptoms? Symptoms of this condition include:  Needing to pee right away (urgently).  Peeing often.  Peeing small amounts often.  Pain or burning when peeing.  Blood in the pee.  Pee that smells bad or not like normal.  Trouble peeing.  Pee that is cloudy.  Fluid coming from the vagina, if you are male.  Pain in the belly or lower  back. Other symptoms include:  Throwing up (vomiting).  No urge to eat.  Feeling mixed up (confused).  Being tired and grouchy (irritable).  A fever.  Watery poop (diarrhea). How is this treated? This condition may be treated with:  Antibiotic medicine.  Other medicines.  Drinking enough water. Follow these instructions at home:  Medicines  Take over-the-counter and prescription medicines only as told by your doctor.  If you were prescribed an antibiotic medicine, take it as told by your doctor. Do not stop taking it even if you start to feel better. General instructions  Make sure you: ? Pee until your bladder is empty. ? Do not hold pee for a long time. ? Empty your bladder after sex. ? Wipe from front to back after pooping if you are a male. Use each tissue one time when you wipe.  Drink enough fluid to keep your pee pale yellow.  Keep all follow-up visits as told by your doctor. This is important. Contact a doctor if:  You do not get better after 1-2 days.  Your symptoms go away and then come back. Get help right away if:  You have very bad back pain.  You have very bad pain in your lower belly.  You have a fever.  You are sick to your stomach (nauseous).  You are throwing up. Summary  A urinary tract infection (UTI) is an infection of any part of the urinary tract.  This condition is caused by germs in your genital area.  There are many risk factors for a UTI. These include having a small, thin tube to drain pee and not being able to control when you pee or poop.  Treatment includes antibiotic medicines for germs.  Drink enough fluid to keep your pee pale yellow. This information is not intended to replace advice given to you by your health care provider. Make sure you discuss any questions you have with your health care provider. Document Released: 05/05/2008 Document Revised: 05/27/2018 Document Reviewed: 05/27/2018 Elsevier Interactive  Patient Education  2019 [employer].

## 2018-11-19 LAB — URINE CULTURE
MICRO NUMBER:: 91520737
SPECIMEN QUALITY:: ADEQUATE

## 2018-12-21 DIAGNOSIS — R351 Nocturia: Secondary | ICD-10-CM | POA: Diagnosis not present

## 2018-12-21 DIAGNOSIS — R350 Frequency of micturition: Secondary | ICD-10-CM | POA: Diagnosis not present

## 2018-12-21 DIAGNOSIS — R310 Gross hematuria: Secondary | ICD-10-CM | POA: Diagnosis not present

## 2019-01-04 DIAGNOSIS — R310 Gross hematuria: Secondary | ICD-10-CM | POA: Diagnosis not present

## 2019-01-13 DIAGNOSIS — R350 Frequency of micturition: Secondary | ICD-10-CM | POA: Diagnosis not present

## 2019-01-13 DIAGNOSIS — R310 Gross hematuria: Secondary | ICD-10-CM | POA: Diagnosis not present

## 2019-10-07 DIAGNOSIS — Z03818 Encounter for observation for suspected exposure to other biological agents ruled out: Secondary | ICD-10-CM | POA: Diagnosis not present

## 2020-01-22 ENCOUNTER — Ambulatory Visit: Payer: Medicare HMO | Attending: Internal Medicine

## 2020-01-22 DIAGNOSIS — Z23 Encounter for immunization: Secondary | ICD-10-CM | POA: Insufficient documentation

## 2020-01-22 NOTE — Progress Notes (Signed)
   Covid-19 Vaccination Clinic  Name:  Jorge Rogers    MRN: [id number] DOB: 06-05-1951  01/22/2020  [name] was observed post Covid-19 immunization for 15 minutes without incidence. He was provided with Vaccine Information Sheet and instruction to access the V-Safe system.   [name] was instructed to call 911 with any severe reactions post vaccine: Marland Kitchen Difficulty breathing  . Swelling of your face and throat  . A fast heartbeat  . A bad rash all over your body  . Dizziness and weakness    Immunizations Administered    Name Date Dose VIS Date Route   Pfizer COVID-19 Vaccine 01/22/2020  8:41 AM 0.3 mL 11/11/2019 Intramuscular   Manufacturer: [place]   Lot: [id number]   [place]: [id number]

## 2020-02-07 ENCOUNTER — Ambulatory Visit: Payer: Medicare HMO | Admitting: Family Medicine

## 2020-02-14 ENCOUNTER — Ambulatory Visit: Payer: Medicare HMO | Attending: Internal Medicine

## 2020-02-14 DIAGNOSIS — Z23 Encounter for immunization: Secondary | ICD-10-CM

## 2020-02-14 NOTE — Progress Notes (Signed)
   Covid-19 Vaccination Clinic  Name:  Jorge Rogers    MRN: [id number] DOB: [birth year]  02/14/2020  Jorge Rogers was observed post Covid-19 immunization for 15 minutes without incident. He was provided with Vaccine Information Sheet and instruction to access the V-Safe system.   Jorge Rogers was instructed to call 911 with any severe reactions post vaccine: Marland Kitchen Difficulty breathing  . Swelling of face and throat  . A fast heartbeat  . A bad rash all over body  . Dizziness and weakness   Immunizations Administered    Name Date Dose VIS Date Route   Pfizer COVID-19 Vaccine 02/14/2020  2:06 PM 0.3 mL 11/11/2019 Intramuscular   Manufacturer: [place]   Lot: [id number]   [place]: [id number]

## 2020-07-12 DIAGNOSIS — H524 Presbyopia: Secondary | ICD-10-CM | POA: Diagnosis not present

## 2020-09-04 ENCOUNTER — Encounter: Payer: Self-pay | Admitting: Internal Medicine

## 2020-09-04 ENCOUNTER — Other Ambulatory Visit: Payer: Self-pay

## 2020-09-04 ENCOUNTER — Ambulatory Visit (INDEPENDENT_AMBULATORY_CARE_PROVIDER_SITE_OTHER): Payer: Medicare HMO | Admitting: Internal Medicine

## 2020-09-04 VITALS — BP 110/72 | HR 65 | Temp 98.3°F | Ht 71.0 in | Wt 200.0 lb

## 2020-09-04 DIAGNOSIS — N320 Bladder-neck obstruction: Secondary | ICD-10-CM

## 2020-09-04 DIAGNOSIS — E785 Hyperlipidemia, unspecified: Secondary | ICD-10-CM

## 2020-09-04 DIAGNOSIS — Z0000 Encounter for general adult medical examination without abnormal findings: Secondary | ICD-10-CM | POA: Diagnosis not present

## 2020-09-04 DIAGNOSIS — F439 Reaction to severe stress, unspecified: Secondary | ICD-10-CM

## 2020-09-04 MED ORDER — VITAMIN D3 50 MCG (2000 UT) PO CAPS: 2000.0000 [IU] | ORAL_CAPSULE | Freq: Every day | ORAL | 3 refills | Status: AC

## 2020-09-04 NOTE — Progress Notes (Signed)
Subjective:  Patient ID: Jorge Rogers, male    DOB: 05/06/1951  Age: 69 y.o. MRN: [id number]  CC: No chief complaint on file.   HPI Jorge Rogers presents for well exam - new pt - not seen >3 years C/o neck pain - resolve C/o some emotional issues lately.  He is concerned with his inability to forgive people, stress.  Alondra would like to see a psychologist.  Due colon 2022  Outpatient Medications Prior to Visit  Medication Sig Dispense Refill  . aspirin 81 MG tablet Take 81 mg by mouth daily.      Marland Kitchen omeprazole (PRILOSEC) 20 MG capsule Take 20 mg by mouth daily.      . ciprofloxacin (CIPRO) 500 MG tablet Take 1 tablet (500 mg total) by mouth 2 (two) times daily. (Patient not taking: Reported on 09/04/2020) 14 tablet 0   No facility-administered medications prior to visit.    ROS: Review of Systems  Constitutional: Negative for appetite change, fatigue and unexpected weight change.  HENT: Negative for congestion, nosebleeds, sneezing, sore throat and trouble swallowing.   Eyes: Negative for itching and visual disturbance.  Respiratory: Negative for cough.   Cardiovascular: Negative for chest pain, palpitations and leg swelling.  Gastrointestinal: Negative for abdominal distention, blood in stool, diarrhea and nausea.  Genitourinary: Negative for frequency and hematuria.  Musculoskeletal: Positive for neck stiffness. Negative for back pain, gait problem, joint swelling and neck pain.  Skin: Negative for rash.  Neurological: Negative for dizziness, tremors, speech difficulty and weakness.  Hematological: Does not bruise/bleed easily.  Psychiatric/Behavioral: Negative for agitation, behavioral problems, decreased concentration, dysphoric mood, sleep disturbance and suicidal ideas. The patient is not nervous/anxious.   Not homicidal  Objective:  BP 110/72 (BP Location: Right Arm, Patient Position: Sitting, Cuff Size: Large)   Pulse 65   Temp 98.3 F (36.8 C) (Oral)   Ht 5\' 11"   (1.803 m)   Wt 200 lb (90.7 kg)   SpO2 97%   BMI 27.89 kg/m   BP Readings from Last 3 Encounters:  09/04/20 110/72  11/18/18 114/74  05/25/18 102/62    Wt Readings from Last 3 Encounters:  09/04/20 200 lb (90.7 kg)  11/18/18 212 lb (96.2 kg)  05/25/18 201 lb (91.2 kg)    Physical Exam Constitutional:      General: He is not in acute distress.    Appearance: Normal appearance. He is well-developed.     Comments: NAD  Eyes:     Conjunctiva/sclera: Conjunctivae normal.     Pupils: Pupils are equal, round, and reactive to light.  Neck:     Thyroid: No thyromegaly.     Vascular: No JVD.  Cardiovascular:     Rate and Rhythm: Normal rate and regular rhythm.     Heart sounds: Normal heart sounds. No murmur heard.  No friction rub. No gallop.   Pulmonary:     Effort: Pulmonary effort is normal. No respiratory distress.     Breath sounds: Normal breath sounds. No wheezing or rales.  Chest:     Chest wall: No tenderness.  Abdominal:     General: Bowel sounds are normal. There is no distension.     Palpations: Abdomen is soft. There is no mass.     Tenderness: There is no abdominal tenderness. There is no guarding or rebound.  Genitourinary:    Prostate: Normal.     Rectum: Normal. Guaiac result negative.  Musculoskeletal:        General:  No tenderness. Normal range of motion.     Cervical back: Normal range of motion.  Lymphadenopathy:     Cervical: No cervical adenopathy.  Skin:    General: Skin is warm and dry.     Findings: No rash.  Neurological:     Mental Status: He is alert and oriented to person, place, and time.     Cranial Nerves: No cranial nerve deficit.     Motor: No abnormal muscle tone.     Coordination: Coordination normal.     Gait: Gait normal.     Deep Tendon Reflexes: Reflexes are normal and symmetric.  Psychiatric:        Behavior: Behavior normal.        Thought Content: Thought content normal.        Judgment: Judgment normal.   The patient  is very pleasant and articulate.  There is no visible emotional distress.  Lab Results  Component Value Date   WBC 10.5 11/18/2018   HGB 15.6 11/18/2018   HCT 45.6 11/18/2018   PLT 234.0 11/18/2018   GLUCOSE 71 11/18/2018   CHOL 169 03/24/2017   TRIG 75.0 03/24/2017   HDL 52.30 03/24/2017   LDLCALC 102 (H) 03/24/2017   ALT 14 11/18/2018   AST 15 11/18/2018   NA 139 11/18/2018   K 4.0 11/18/2018   CL 102 11/18/2018   CREATININE 1.27 11/18/2018   BUN 13 11/18/2018   CO2 29 11/18/2018   TSH 1.93 03/24/2017   PSA 4.53 (H) 11/18/2018   INR 2.2 RATIO (H) 07/15/2007   MICROALBUR 35.7 Repeated and verified X2. (H) 06/20/2013    DG Knee Complete 4 Views Left  Result Date: 01/29/2018 CLINICAL DATA:  Generalized knee pain with exercising for 2 months, no known injury EXAM: LEFT KNEE - COMPLETE 4+ VIEW COMPARISON:  None FINDINGS: Osseous demineralization. Medial compartment joint space narrowing and mild spur formation. 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 No acute fracture, dislocation, or bone destruction. No knee joint effusion. IMPRESSION: Degenerative changes at medial compartment LEFT knee. Electronically Signed   By: Lavonia Dana M.D.   On: 01/29/2018 17:20    Assessment & Plan:    Walker Kehr, MD

## 2020-09-04 NOTE — Assessment & Plan Note (Signed)
°  We discussed age appropriate health related issues, including available/recomended screening tests and vaccinations. Labs were ordered to be later reviewed . All questions were answered. We discussed one or more of the following - seat belt use, use of sunscreen/sun exposure exercise, safe sex, fall risk reduction, second hand smoke exposure, firearm use and storage, seat belt use, a need for adhering to healthy diet and exercise. Labs were ordered.  All questions were answered.  Cor calc CT offered  Due colon 2022

## 2020-09-04 NOTE — Patient Instructions (Addendum)
[place] vaccine booster sign up: please call [facility] at [phone]. You can also call the venue where you had your initial COVID 19 vaccination.   Cardiac CT calcium scoring test $150 Tel # is [phone]   Computed tomography, more commonly known as a CT or CAT scan, is a diagnostic medical imaging test. Like traditional x-rays, it produces multiple images or pictures of the inside of the body. The cross-sectional images generated during a CT scan can be reformatted in multiple planes. They can even generate three-dimensional images. These images can be viewed on a computer monitor, printed on film or by a 3D printer, or transferred to a CD or DVD. CT images of internal organs, bones, soft tissue and blood vessels provide greater detail than traditional x-rays, particularly of soft tissues and blood vessels. A cardiac CT scan for coronary calcium is a non-invasive way of obtaining information about the presence, location and extent of calcified plaque in the coronary arteries--the vessels that supply oxygen-containing blood to the heart muscle. Calcified plaque results when there is a build-up of fat and other substances under the inner layer of the artery. This material can calcify which signals the presence of atherosclerosis, a disease of the vessel wall, also called coronary artery disease (CAD). People with this disease have an increased risk for heart attacks. In addition, over time, progression of plaque build up (CAD) can narrow the arteries or even close off blood flow to the heart. The result may be chest pain, sometimes called "angina," or a heart attack. Because calcium is a marker of CAD, the amount of calcium detected on a cardiac CT scan is a helpful prognostic tool. The findings on cardiac CT are expressed as a calcium score. Another name for this test is coronary artery calcium scoring.  What are some common uses of the procedure? The goal of cardiac CT scan for  calcium scoring is to determine if CAD is present and to what extent, even if there are no symptoms. It is a screening study that may be recommended by a physician for patients with risk factors for CAD but no clinical symptoms. The major risk factors for CAD are: . high blood cholesterol levels  . family history of heart attacks  . diabetes  . high blood pressure  . cigarette smoking  . overweight or obese  . physical inactivity   A negative cardiac CT scan for calcium scoring shows no calcification within the coronary arteries. This suggests that CAD is absent or so minimal it cannot be seen by this technique. The chance of having a heart attack over the next two to five years is very low under these circumstances. A positive test means that CAD is present, regardless of whether or not the patient is experiencing any symptoms. The amount of calcification--expressed as the calcium score--may help to predict the likelihood of a myocardial infarction (heart attack) in the coming years and helps your medical doctor or cardiologist decide whether the patient may need to take preventive medicine or undertake other measures such as diet and exercise to lower the risk for heart attack. The extent of CAD is graded according to your calcium score:  Calcium Score  Presence of CAD (coronary artery disease)  0 No evidence of CAD   1-10 Minimal evidence of CAD  11-100 Mild evidence of CAD  101-400 Moderate evidence of CAD  Over 400 Extensive evidence of CAD

## 2020-09-07 ENCOUNTER — Encounter: Payer: Self-pay | Admitting: Internal Medicine

## 2020-09-10 ENCOUNTER — Other Ambulatory Visit (INDEPENDENT_AMBULATORY_CARE_PROVIDER_SITE_OTHER): Payer: Medicare HMO

## 2020-09-10 DIAGNOSIS — Z125 Encounter for screening for malignant neoplasm of prostate: Secondary | ICD-10-CM | POA: Diagnosis not present

## 2020-09-10 DIAGNOSIS — E785 Hyperlipidemia, unspecified: Secondary | ICD-10-CM | POA: Diagnosis not present

## 2020-09-10 DIAGNOSIS — N320 Bladder-neck obstruction: Secondary | ICD-10-CM | POA: Diagnosis not present

## 2020-09-10 DIAGNOSIS — Z0000 Encounter for general adult medical examination without abnormal findings: Secondary | ICD-10-CM | POA: Diagnosis not present

## 2020-09-10 LAB — LIPID PANEL
Cholesterol: 178 mg/dL (ref 0–200)
HDL: 57.2 mg/dL (ref >39.00)
LDL Cholesterol: 104 mg/dL — ABNORMAL HIGH (ref 0–99)
NonHDL: 120.33
Total CHOL/HDL Ratio: 3
Triglycerides: 83 mg/dL (ref 0.0–149.0)
VLDL: 16.6 mg/dL (ref 0.0–40.0)

## 2020-09-10 LAB — URINALYSIS
Bilirubin Urine: NEGATIVE
Ketones, ur: NEGATIVE
Leukocytes,Ua: NEGATIVE
Nitrite: NEGATIVE
Specific Gravity, Urine: 1.015 (ref 1.000–1.030)
Total Protein, Urine: NEGATIVE
Urine Glucose: NEGATIVE
Urobilinogen, UA: 0.2 (ref 0.0–1.0)
pH: 8 (ref 5.0–8.0)

## 2020-09-10 LAB — COMPREHENSIVE METABOLIC PANEL
ALT: 15 U/L (ref 0–53)
AST: 20 U/L (ref 0–37)
Albumin: 4.2 g/dL (ref 3.5–5.2)
Alkaline Phosphatase: 60 U/L (ref 39–117)
BUN: 11 mg/dL (ref 6–23)
CO2: 28 mEq/L (ref 19–32)
Calcium: 9.4 mg/dL (ref 8.4–10.5)
Chloride: 105 mEq/L (ref 96–112)
Creatinine, Ser: 1.14 mg/dL (ref 0.40–1.50)
GFR: 65.18 mL/min (ref >60.00)
Glucose, Bld: 90 mg/dL (ref 70–99)
Potassium: 4.3 mEq/L (ref 3.5–5.1)
Sodium: 140 mEq/L (ref 135–145)
Total Bilirubin: 0.8 mg/dL (ref 0.2–1.2)
Total Protein: 6.7 g/dL (ref 6.0–8.3)

## 2020-09-10 LAB — CBC WITH DIFFERENTIAL/PLATELET
Basophils Absolute: 0 10*3/uL (ref 0.0–0.1)
Basophils Relative: 0.6 % (ref 0.0–3.0)
Eosinophils Absolute: 0.1 10*3/uL (ref 0.0–0.7)
Eosinophils Relative: 2.3 % (ref 0.0–5.0)
HCT: 45.2 % (ref 39.0–52.0)
Hemoglobin: 15.6 g/dL (ref 13.0–17.0)
Lymphocytes Relative: 45.6 % (ref 12.0–46.0)
Lymphs Abs: 2.6 10*3/uL (ref 0.7–4.0)
MCHC: 34.5 g/dL (ref 30.0–36.0)
MCV: 94.8 fl (ref 78.0–100.0)
Monocytes Absolute: 0.6 10*3/uL (ref 0.1–1.0)
Monocytes Relative: 9.6 % (ref 3.0–12.0)
Neutro Abs: 2.4 10*3/uL (ref 1.4–7.7)
Neutrophils Relative %: 41.9 % — ABNORMAL LOW (ref 43.0–77.0)
Platelets: 206 10*3/uL (ref 150.0–400.0)
RBC: 4.77 Mil/uL (ref 4.22–5.81)
RDW: 13.2 % (ref 11.5–15.5)
WBC: 5.7 10*3/uL (ref 4.0–10.5)

## 2020-09-10 LAB — TSH: TSH: 2.77 u[IU]/mL (ref 0.35–4.50)

## 2020-09-10 LAB — PSA: PSA: 1.37 ng/mL (ref 0.10–4.00)

## 2020-09-21 ENCOUNTER — Ambulatory Visit (INDEPENDENT_AMBULATORY_CARE_PROVIDER_SITE_OTHER)
Admission: RE | Admit: 2020-09-21 | Discharge: 2020-09-21 | Disposition: A | Discharge status: Home / Self Care | Payer: Self-pay | Source: Ambulatory Visit | Attending: Internal Medicine | Admitting: Internal Medicine

## 2020-09-21 ENCOUNTER — Other Ambulatory Visit: Payer: Self-pay

## 2020-09-21 DIAGNOSIS — E785 Hyperlipidemia, unspecified: Secondary | ICD-10-CM

## 2020-09-24 ENCOUNTER — Telehealth: Payer: Self-pay | Admitting: Internal Medicine

## 2020-09-24 DIAGNOSIS — I2583 Coronary atherosclerosis due to lipid rich plaque: Secondary | ICD-10-CM

## 2020-09-24 DIAGNOSIS — I2510 Atherosclerotic heart disease of native coronary artery without angina pectoris: Secondary | ICD-10-CM

## 2020-09-24 NOTE — Telephone Encounter (Signed)
    Please return call to discuss CT scoring reults

## 2020-09-26 NOTE — Telephone Encounter (Signed)
° °  Patient calling back, states he was told medication would be called to pharmacy for cholesterol. Please call to [id number] - [place], [place]

## 2020-09-28 DIAGNOSIS — I2583 Coronary atherosclerosis due to lipid rich plaque: Secondary | ICD-10-CM | POA: Insufficient documentation

## 2020-09-28 DIAGNOSIS — I2510 Atherosclerotic heart disease of native coronary artery without angina pectoris: Secondary | ICD-10-CM | POA: Insufficient documentation

## 2020-09-28 MED ORDER — ROSUVASTATIN CALCIUM 5 MG PO TABS: 5.0000 mg | ORAL_TABLET | Freq: Every day | ORAL | 11 refills | Status: DC

## 2020-09-28 NOTE — Addendum Note (Signed)
Addended by: Cassandria Anger on: 09/28/2020 10:21 AM   Modules accepted: Orders

## 2020-09-28 NOTE — Telephone Encounter (Signed)
Jorge Rogers  Will email the prescription for Crestor.  See me in 3 months with labs prior.  Thanks

## 2020-09-28 NOTE — Telephone Encounter (Signed)
Pt notified appt sch. For 01/25 at 9:10

## 2020-12-24 ENCOUNTER — Other Ambulatory Visit: Payer: Self-pay

## 2020-12-25 ENCOUNTER — Encounter: Payer: Self-pay | Admitting: Internal Medicine

## 2020-12-25 ENCOUNTER — Ambulatory Visit (INDEPENDENT_AMBULATORY_CARE_PROVIDER_SITE_OTHER): Payer: Medicare HMO | Admitting: Internal Medicine

## 2020-12-25 ENCOUNTER — Ambulatory Visit (INDEPENDENT_AMBULATORY_CARE_PROVIDER_SITE_OTHER): Payer: Medicare HMO

## 2020-12-25 VITALS — BP 110/70 | HR 66 | Temp 98.4°F | Resp 16 | Ht 71.0 in | Wt 202.6 lb

## 2020-12-25 DIAGNOSIS — F489 Nonpsychotic mental disorder, unspecified: Secondary | ICD-10-CM | POA: Diagnosis not present

## 2020-12-25 DIAGNOSIS — I2510 Atherosclerotic heart disease of native coronary artery without angina pectoris: Secondary | ICD-10-CM | POA: Diagnosis not present

## 2020-12-25 DIAGNOSIS — Z0000 Encounter for general adult medical examination without abnormal findings: Secondary | ICD-10-CM | POA: Diagnosis not present

## 2020-12-25 DIAGNOSIS — I2583 Coronary atherosclerosis due to lipid rich plaque: Secondary | ICD-10-CM | POA: Diagnosis not present

## 2020-12-25 DIAGNOSIS — F4321 Adjustment disorder with depressed mood: Secondary | ICD-10-CM

## 2020-12-25 LAB — COMPREHENSIVE METABOLIC PANEL
ALT: 18 U/L (ref 0–53)
AST: 21 U/L (ref 0–37)
Albumin: 4.7 g/dL (ref 3.5–5.2)
Alkaline Phosphatase: 65 U/L (ref 39–117)
BUN: 19 mg/dL (ref 6–23)
CO2: 28 mEq/L (ref 19–32)
Calcium: 10.1 mg/dL (ref 8.4–10.5)
Chloride: 102 mEq/L (ref 96–112)
Creatinine, Ser: 1.27 mg/dL (ref 0.40–1.50)
GFR: 57.67 mL/min — ABNORMAL LOW (ref >60.00)
Glucose, Bld: 84 mg/dL (ref 70–99)
Potassium: 4.3 mEq/L (ref 3.5–5.1)
Sodium: 136 mEq/L (ref 135–145)
Total Bilirubin: 0.6 mg/dL (ref 0.2–1.2)
Total Protein: 7.7 g/dL (ref 6.0–8.3)

## 2020-12-25 LAB — LIPID PANEL
Cholesterol: 141 mg/dL (ref 0–200)
HDL: 63.7 mg/dL (ref >39.00)
LDL Cholesterol: 67 mg/dL (ref 0–99)
NonHDL: 77.33
Total CHOL/HDL Ratio: 2
Triglycerides: 54 mg/dL (ref 0.0–149.0)
VLDL: 10.8 mg/dL (ref 0.0–40.0)

## 2020-12-25 MED ORDER — TRIAMCINOLONE ACETONIDE 0.1 % EX OINT: 1.0000 "application " | TOPICAL_OINTMENT | Freq: Two times a day (BID) | CUTANEOUS | 2 refills | Status: DC

## 2020-12-25 NOTE — Assessment & Plan Note (Addendum)
Crestor low dose Labs today

## 2020-12-25 NOTE — Patient Instructions (Signed)
Mr. Jorge Rogers , Thank you for taking time to come for your Medicare Wellness Visit. I appreciate your ongoing commitment to your health goals. Please review the following plan we discussed and let me know if I can assist you in the future.   Screening recommendations/referrals: Colonoscopy: 09/29/2011; due every 10 years (08/2021) Recommended yearly ophthalmology/optometry visit for glaucoma screening and checkup Recommended yearly dental visit for hygiene and checkup  Vaccinations: Influenza vaccine: 11/01/2020 Pneumococcal vaccine: 03/13/2017; need Pneumovax 23 Tdap vaccine: 12/22/2012; due every 10 years (2024) Shingles vaccine: never done   Covid-19: 01/22/2020, 02/14/2020  Advanced directives: Advance directive discussed with you today. Even though you declined this today please call our office should you change your mind and we can give you the proper paperwork for you to fill out.  Conditions/risks identified: Yes; Reviewed health maintenance screenings with patient today and relevant education, vaccines, and/or referrals were provided. Please continue to do your personal lifestyle choices by: daily care of teeth and gums, regular physical activity (goal should be 5 days a week for 30 minutes), eat a healthy diet, avoid tobacco and drug use, limiting any alcohol intake, taking a low-dose aspirin (if not allergic or have been advised by your provider otherwise) and taking vitamins and minerals as recommended by your provider. Continue doing brain stimulating activities (puzzles, reading, adult coloring books, staying active) to keep memory sharp. Continue to eat heart healthy diet (full of fruits, vegetables, whole grains, lean protein, water--limit salt, fat, and sugar intake) and increase physical activity as tolerated.  Next appointment: Please schedule your next Medicare Wellness Visit with your Nurse Health Advisor in 1 year by calling [phone].  Preventive Care 47 Years and Older,  Male Preventive care refers to lifestyle choices and visits with your health care provider that can promote health and wellness. What does preventive care include?  A yearly physical exam. This is also called an annual well check.  Dental exams once or twice a year.  Routine eye exams. Ask your health care provider how often you should have your eyes checked.  Personal lifestyle choices, including:  Daily care of your teeth and gums.  Regular physical activity.  Eating a healthy diet.  Avoiding tobacco and drug use.  Limiting alcohol use.  Practicing safe sex.  Taking low doses of aspirin every day.  Taking vitamin and mineral supplements as recommended by your health care provider. What happens during an annual well check? The services and screenings done by your health care provider during your annual well check will depend on your age, overall health, lifestyle risk factors, and family history of disease. Counseling  Your health care provider may ask you questions about your:  Alcohol use.  Tobacco use.  Drug use.  Emotional well-being.  Home and relationship well-being.  Sexual activity.  Eating habits.  History of falls.  Memory and ability to understand (cognition).  Work and work Statistician. Screening  You may have the following tests or measurements:  Height, weight, and BMI.  Blood pressure.  Lipid and cholesterol levels. These may be checked every 5 years, or more frequently if you are over 25 years old.  Skin check.  Lung cancer screening. You may have this screening every year starting at age 59 if you have a 30-pack-year history of smoking and currently smoke or have quit within the past 15 years.  Fecal occult blood test (FOBT) of the stool. You may have this test every year starting at age 60.  Flexible sigmoidoscopy  or colonoscopy. You may have a sigmoidoscopy every 5 years or a colonoscopy every 10 years starting at age  32.  Prostate cancer screening. Recommendations will vary depending on your family history and other risks.  Hepatitis C blood test.  Hepatitis B blood test.  Sexually transmitted disease (STD) testing.  Diabetes screening. This is done by checking your blood sugar (glucose) after you have not eaten for a while (fasting). You may have this done every 1-3 years.  Abdominal aortic aneurysm (AAA) screening. You may need this if you are a current or former smoker.  Osteoporosis. You may be screened starting at age 56 if you are at high risk. Talk with your health care provider about your test results, treatment options, and if necessary, the need for more tests. Vaccines  Your health care provider may recommend certain vaccines, such as:  Influenza vaccine. This is recommended every year.  Tetanus, diphtheria, and acellular pertussis (Tdap, Td) vaccine. You may need a Td booster every 10 years.  Zoster vaccine. You may need this after age 6.  Pneumococcal 13-valent conjugate (PCV13) vaccine. One dose is recommended after age 24.  Pneumococcal polysaccharide (PPSV23) vaccine. One dose is recommended after age 41. Talk to your health care provider about which screenings and vaccines you need and how often you need them. This information is not intended to replace advice given to you by your health care provider. Make sure you discuss any questions you have with your health care provider. Document Released: 12/14/2015 Document Revised: 08/06/2016 Document Reviewed: 09/18/2015 Elsevier Interactive Patient Education  2017 [place] Prevention in the Home Falls can cause injuries. They can happen to people of all ages. There are many things you can do to make your home safe and to help prevent falls. What can I do on the outside of my home?  Regularly fix the edges of walkways and driveways and fix any cracks.  Remove anything that might make you trip as you walk through a  door, such as a raised step or threshold.  Trim any bushes or trees on the path to your home.  Use bright outdoor lighting.  Clear any walking paths of anything that might make someone trip, such as rocks or tools.  Regularly check to see if handrails are loose or broken. Make sure that both sides of any steps have handrails.  Any raised decks and porches should have guardrails on the edges.  Have any leaves, snow, or ice cleared regularly.  Use sand or salt on walking paths during winter.  Clean up any spills in your garage right away. This includes oil or grease spills. What can I do in the bathroom?  Use night lights.  Install grab bars by the toilet and in the tub and shower. Do not use towel bars as grab bars.  Use non-skid mats or decals in the tub or shower.  If you need to sit down in the shower, use a plastic, non-slip stool.  Keep the floor dry. Clean up any water that spills on the floor as soon as it happens.  Remove soap buildup in the tub or shower regularly.  Attach bath mats securely with double-sided non-slip rug tape.  Do not have throw rugs and other things on the floor that can make you trip. What can I do in the bedroom?  Use night lights.  Make sure that you have a light by your bed that is easy to reach.  Do not use  any sheets or blankets that are too big for your bed. They should not hang down onto the floor.  Have a firm chair that has side arms. You can use this for support while you get dressed.  Do not have throw rugs and other things on the floor that can make you trip. What can I do in the kitchen?  Clean up any spills right away.  Avoid walking on wet floors.  Keep items that you use a lot in easy-to-reach places.  If you need to reach something above you, use a strong step stool that has a grab bar.  Keep electrical cords out of the way.  Do not use floor polish or wax that makes floors slippery. If you must use wax, use  non-skid floor wax.  Do not have throw rugs and other things on the floor that can make you trip. What can I do with my stairs?  Do not leave any items on the stairs.  Make sure that there are handrails on both sides of the stairs and use them. Fix handrails that are broken or loose. Make sure that handrails are as long as the stairways.  Check any carpeting to make sure that it is firmly attached to the stairs. Fix any carpet that is loose or worn.  Avoid having throw rugs at the top or bottom of the stairs. If you do have throw rugs, attach them to the floor with carpet tape.  Make sure that you have a light switch at the top of the stairs and the bottom of the stairs. If you do not have them, ask someone to add them for you. What else can I do to help prevent falls?  Wear shoes that:  Do not have high heels.  Have rubber bottoms.  Are comfortable and fit you well.  Are closed at the toe. Do not wear sandals.  If you use a stepladder:  Make sure that it is fully opened. Do not climb a closed stepladder.  Make sure that both sides of the stepladder are locked into place.  Ask someone to hold it for you, if possible.  Clearly mark and make sure that you can see:  Any grab bars or handrails.  First and last steps.  Where the edge of each step is.  Use tools that help you move around (mobility aids) if they are needed. These include:  Canes.  Walkers.  Scooters.  Crutches.  Turn on the lights when you go into a dark area. Replace any light bulbs as soon as they burn out.  Set up your furniture so you have a clear path. Avoid moving your furniture around.  If any of your floors are uneven, fix them.  If there are any pets around you, be aware of where they are.  Review your medicines with your doctor. Some medicines can make you feel dizzy. This can increase your chance of falling. Ask your doctor what other things that you can do to help prevent falls. This  information is not intended to replace advice given to you by your health care provider. Make sure you discuss any questions you have with your health care provider. Document Released: 09/13/2009 Document Revised: 04/24/2016 Document Reviewed: 12/22/2014 Elsevier Interactive Patient Education  2017 [employer].

## 2020-12-25 NOTE — Assessment & Plan Note (Addendum)
Mother just died at 79 -- 1/22 Discussed Psychology referral

## 2020-12-25 NOTE — Progress Notes (Signed)
Subjective:  Patient ID: Jorge Rogers, male    DOB: 1951/02/26  Age: 70 y.o. MRN: [id number]  CC: Follow-up (6 month f/u)   HPI Jorge Rogers presents for dry skin - legs, back Mother just died at 48 - grieving  Outpatient Medications Prior to Visit  Medication Sig Dispense Refill  . aspirin 81 MG tablet Take 81 mg by mouth daily.    . Cholecalciferol (VITAMIN D3) 50 MCG (2000 UT) capsule Take 1 capsule (2,000 Units total) by mouth daily. 100 capsule 3  . omeprazole (PRILOSEC) 20 MG capsule Take 20 mg by mouth daily.    . rosuvastatin (CRESTOR) 5 MG tablet Take 1 tablet (5 mg total) by mouth daily. 30 tablet 11  . ciprofloxacin (CIPRO) 500 MG tablet Take 1 tablet (500 mg total) by mouth 2 (two) times daily. (Patient not taking: No sig reported) 14 tablet 0  . FLUAD QUADRIVALENT 0.5 ML injection  (Patient not taking: Reported on 12/25/2020)     No facility-administered medications prior to visit.    ROS: Review of Systems  Constitutional: Negative for appetite change, fatigue and unexpected weight change.  HENT: Negative for congestion, nosebleeds, sneezing, sore throat and trouble swallowing.   Eyes: Negative for itching and visual disturbance.  Respiratory: Negative for cough.   Cardiovascular: Negative for chest pain, palpitations and leg swelling.  Gastrointestinal: Negative for abdominal distention, blood in stool, diarrhea and nausea.  Genitourinary: Negative for frequency and hematuria.  Musculoskeletal: Negative for back pain, gait problem, joint swelling and neck pain.  Skin: Negative for rash.  Neurological: Negative for dizziness, tremors, speech difficulty and weakness.  Psychiatric/Behavioral: Positive for dysphoric mood. Negative for agitation, sleep disturbance and suicidal ideas. The patient is not nervous/anxious.     Objective:  BP 110/70 (BP Location: Left Arm)   Pulse 66   Temp 98.4 F (36.9 C) (Oral)   Ht 5\' 11"  (1.803 m)   Wt 202 lb 9.6 oz (91.9 kg)    SpO2 98%   BMI 28.26 kg/m   BP Readings from Last 3 Encounters:  12/25/20 110/70  12/25/20 110/70  09/04/20 110/72    Wt Readings from Last 3 Encounters:  12/25/20 202 lb 9.6 oz (91.9 kg)  12/25/20 202 lb 9.6 oz (91.9 kg)  09/04/20 200 lb (90.7 kg)    Physical Exam Constitutional:      General: He is not in acute distress.    Appearance: He is well-developed.     Comments: NAD  HENT:     Mouth/Throat:     Mouth: Oropharynx is clear and moist.  Eyes:     Conjunctiva/sclera: Conjunctivae normal.     Pupils: Pupils are equal, round, and reactive to light.  Neck:     Thyroid: No thyromegaly.     Vascular: No JVD.  Cardiovascular:     Rate and Rhythm: Normal rate and regular rhythm.     Pulses: Intact distal pulses.     Heart sounds: Normal heart sounds. No murmur heard. No friction rub. No gallop.   Pulmonary:     Effort: Pulmonary effort is normal. No respiratory distress.     Breath sounds: Normal breath sounds. No wheezing or rales.  Chest:     Chest wall: No tenderness.  Abdominal:     General: Bowel sounds are normal. There is no distension.     Palpations: Abdomen is soft. There is no mass.     Tenderness: There is no abdominal tenderness. There is  no guarding or rebound.  Musculoskeletal:        General: No tenderness or edema. Normal range of motion.     Cervical back: Normal range of motion.  Lymphadenopathy:     Cervical: No cervical adenopathy.  Skin:    General: Skin is warm and dry.     Findings: Rash present.  Neurological:     Mental Status: He is alert and oriented to person, place, and time.     Cranial Nerves: No cranial nerve deficit.     Motor: No abnormal muscle tone.     Coordination: He displays a negative Romberg sign. Coordination normal.     Gait: Gait normal.     Deep Tendon Reflexes: Reflexes are normal and symmetric.  Psychiatric:        Mood and Affect: Mood and affect normal.        Behavior: Behavior normal.        Thought  Content: Thought content normal.        Judgment: Judgment normal.    LEs w/dry skin w/patches of dark skin  Lab Results  Component Value Date   WBC 5.7 09/10/2020   HGB 15.6 09/10/2020   HCT 45.2 09/10/2020   PLT 206.0 09/10/2020   GLUCOSE 90 09/10/2020   CHOL 178 09/10/2020   TRIG 83.0 09/10/2020   HDL 57.20 09/10/2020   LDLCALC 104 (H) 09/10/2020   ALT 15 09/10/2020   AST 20 09/10/2020   NA 140 09/10/2020   K 4.3 09/10/2020   CL 105 09/10/2020   CREATININE 1.14 09/10/2020   BUN 11 09/10/2020   CO2 28 09/10/2020   TSH 2.77 09/10/2020   PSA 1.37 09/10/2020   INR 2.2 RATIO (H) 07/15/2007   MICROALBUR 35.7 Repeated and verified X2. (H) 06/20/2013    CT CARDIAC SCORING  Addendum Date: 09/23/2020   ADDENDUM REPORT: 09/23/2020 22:22 CLINICAL DATA:  Risk stratification EXAM: Coronary Calcium Score TECHNIQUE: The patient was scanned on a [employer]. Axial non-contrast 3 mm slices were carried out through the heart. The data set was analyzed on a dedicated work station and scored using the [place]. FINDINGS: Non-cardiac: See separate report from [facility]. Ascending Aorta: Normal caliber.  No calcifications. Pericardium: Normal Coronary arteries: Normal coronary origins. IMPRESSION: Coronary calcium score of 4. This was 37th percentile for age and sex matched control. Fransico Him Electronically Signed   By: Fransico Him   On: 09/23/2020 22:22   Result Date: 09/23/2020 EXAM: OVER-READ INTERPRETATION  CT CHEST The following report is an over-read performed by radiologist Dr. Aletta Edouard of [facility], [place] on 09/21/2020. This over-read does not include interpretation of cardiac or coronary anatomy or pathology. The coronary calcium score interpretation by the cardiologist is attached. COMPARISON:  CTA of the chest 07/07/2007 FINDINGS: Vascular: There is mild dilatation of the visualized proximal descending thoracic aorta measuring up to  approximately 3.5 cm and estimated diameter by unenhanced CT. On the prior CTA in 2008, the same segment measured approximately 3 cm. The rest of the visualized thoracic aorta is not overtly aneurysmal although the lower descending thoracic aorta is borderline in size measuring 3-3.1 cm. Further correlation with follow-up elective CTA of the chest with contrast would be helpful. Mediastinum/Nodes: Visualized mediastinum and hilar regions demonstrate no lymphadenopathy or abnormal masses. Lungs/Pleura: Visualized lungs show no evidence of pulmonary edema, consolidation, pneumothorax, nodule or pleural fluid. Upper Abdomen: No acute abnormality. Musculoskeletal: No chest wall mass or suspicious bone lesions  identified. 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 On the prior CTA in 2008, the same segment measured approximately 3 cm. Further correlation with follow-up elective CTA of the chest with contrast would be helpful. Electronically Signed: By: Aletta Edouard M.D. On: 09/21/2020 09:14    Assessment & Plan:    Follow-up: No follow-ups on file.  Walker Kehr, MD

## 2020-12-25 NOTE — Progress Notes (Addendum)
Subjective:   Jorge Rogers is a 70 y.o. male who presents for Medicare Annual/Subsequent preventive examination.  Review of Systems    No ROS. Medicare Wellness Visit. Additional risk factors are reflected in social history. Cardiac Risk Factors include: advanced age (>1555men, 2>65 women);dyslipidemia;male gender;family history of premature cardiovascular disease Sleep Patterns: No sleep issues, feels rested on waking and sleeps 7-8 hours nightly. Home Safety/Smoke Alarms: Feels safe in home; uses home alarm. Smoke alarms in place. Living environment: 2-story home; Lives with spouse; no needs for DME; good support system. Seat Belt Safety/Bike Helmet: Wears seat belt.    Objective:    Today's Vitals   12/25/20 0814  BP: 110/70  Pulse: 66  Resp: 16  Temp: 98.4 F (36.9 C)  SpO2: 98%  Weight: 202 lb 9.6 oz (91.9 kg)  Height: 5\' 11"  (1.803 m)  PainSc: 0-No pain   Body mass index is 28.26 kg/m.  Advanced Directives 12/25/2020 11/29/2014  Does Patient Have a Medical Advance Directive? No No  Would patient like information on creating a medical advance directive? No - Patient declined No - patient declined information    Current Medications (verified) Outpatient Encounter Medications as of 12/25/2020  Medication Sig   aspirin 81 MG tablet Take 81 mg by mouth daily.   Cholecalciferol (VITAMIN D3) 50 MCG (2000 UT) capsule Take 1 capsule (2,000 Units total) by mouth daily.   omeprazole (PRILOSEC) 20 MG capsule Take 20 mg by mouth daily.   rosuvastatin (CRESTOR) 5 MG tablet Take 1 tablet (5 mg total) by mouth daily.   [DISCONTINUED] FLUAD QUADRIVALENT 0.5 ML injection  (Patient not taking: Reported on 12/25/2020)   [DISCONTINUED] ciprofloxacin (CIPRO) 500 MG tablet Take 1 tablet (500 mg total) by mouth 2 (two) times daily. (Patient not taking: No sig reported)   No facility-administered encounter medications on file as of 12/25/2020.    Allergies (verified) Patient has no known  allergies.   History: Past Medical History:  Diagnosis Date   Pulmonary embolism (HCC) 07/2007   B PE and RLE DVT   Past Surgical History:  Procedure Laterality Date   ACHILLES TENDON REPAIR     Family History  Problem Relation Age of Onset   Heart disease Father 2075       card arrest   Colon cancer Neg Hx    Social History   Socioeconomic History   Marital status: Married    Spouse name: Not on file   Number of children: Not on file   Years of education: Not on file   Highest education level: Not on file  Occupational History   Not on file  Tobacco Use   Smoking status: Never Smoker   Smokeless tobacco: Never Used  Substance and Sexual Activity   Alcohol use: Yes   Drug use: No   Sexual activity: Yes  Other Topics Concern   Not on file  Social History Narrative   Not on file   Social Determinants of Health   Financial Resource Strain: Low Risk    Difficulty of Paying Living Expenses: Not hard at all  Food Insecurity: No Food Insecurity   Worried About Programme researcher, broadcasting/film/videounning Out of Food in the Last Year: Never true   Ran Out of Food in the Last Year: Never true  Transportation Needs: No Transportation Needs   Lack of Transportation (Medical): No   Lack of Transportation (Non-Medical): No  Physical Activity: Sufficiently Active   Days of Exercise per Week: 5 days  Minutes of Exercise per Session: 30 min  Stress: No Stress Concern Present   Feeling of Stress : Not at all  Social Connections: Socially Integrated   Frequency of Communication with Friends and Family: More than three times a week   Frequency of Social Gatherings with Friends and Family: Once a week   Attends Religious Services: More than 4 times per year   Active Member of [employer] or Organizations: Yes   Attends Music therapist: More than 4 times per year   Marital Status: Married    Tobacco Counseling Counseling given: Not Answered   Clinical Intake:  Pre-visit preparation completed:  Yes  Pain : No/denies pain Pain Score: 0-No pain     BMI - recorded: 28.26 Nutritional Status: BMI 25 -29 Overweight Nutritional Risks: None Diabetes: No  How often do you need to have someone help you when you read instructions, pamphlets, or other written materials from your doctor or pharmacy?: 1 - Never What is the last grade level you completed in school?: High School Graduate; Some college  Diabetic? no  Interpreter Needed?: No  Information entered by :: Lisette Abu, LPN   Activities of Daily Living In your present state of health, do you have any difficulty performing the following activities: 12/25/2020  Hearing? N  Vision? N  Difficulty concentrating or making decisions? N  Walking or climbing stairs? N  Dressing or bathing? N  Doing errands, shopping? N  Preparing Food and eating ? N  Using the Toilet? N  In the past six months, have you accidently leaked urine? N  Do you have problems with loss of bowel control? N  Managing your Medications? N  Managing your Finances? N  Housekeeping or managing your Housekeeping? N  Some recent data might be hidden    Patient Care Team: Plotnikov, Evie Lacks, MD as PCP - General (Internal Medicine)  Indicate any recent Medical Services you may have received from other than [facility] providers in the past year (date may be approximate).     Assessment:   This is a routine wellness examination for Jorge Rogers.  Hearing/Vision screen No exam data present  Dietary issues and exercise activities discussed: Current Exercise Habits: Home exercise routine, Time (Minutes): 30, Frequency (Times/Week): 5, Weekly Exercise (Minutes/Week): 150, Intensity: Moderate, Exercise limited by: orthopedic condition(s)  Goals      Client understands the importance of follow-up with providers by attending scheduled visits     My goal is to increase my physical activity level and continue to be independent and socially active.        Depression Screen PHQ 2/9 Scores 12/25/2020 12/25/2020 09/04/2020 01/29/2018  PHQ - 2 Score 0 0 0 0    Fall Risk Fall Risk  12/25/2020 12/25/2020 09/04/2020 01/29/2018  Falls in the past year? 0 0 0 No  Number falls in past yr: 0 0 0 -  Injury with Fall? 0 0 0 -  Risk for fall due to : No Fall Risks - - -    FALL RISK PREVENTION PERTAINING TO THE HOME:  Any stairs in or around the home? Yes  If so, are there any without handrails? No  Home free of loose throw rugs in walkways, pet beds, electrical cords, etc? Yes  Adequate lighting in your home to reduce risk of falls? Yes   ASSISTIVE DEVICES UTILIZED TO PREVENT FALLS:  Life alert? No  Use of a cane, walker or w/c? No  Grab bars in the bathroom? Yes  Shower chair or bench in shower? No  Elevated toilet seat or a handicapped toilet? No   TIMED UP AND GO:  Was the test performed? No .  Length of time to ambulate 10 feet: 0 sec.   Gait steady and fast without use of assistive device  Cognitive Function: Normal cognitive status assessed by direct observation by this Nurse Health Advisor. No abnormalities found.         Immunizations Immunization History  Administered Date(s) Administered   Influenza-Unspecified 11/01/2020   PFIZER(Purple Top)SARS-COV-2 Vaccination 01/22/2020, 02/14/2020   Pneumococcal Conjugate-13 03/13/2017   Tdap 12/22/2012   Tetanus 10/20/2014    TDAP status: Up to date  Flu Vaccine status: Up to date  Pneumococcal vaccine status: Due, Education has been provided regarding the importance of this vaccine. Advised may receive this vaccine at local pharmacy or Health Dept. Aware to provide a copy of the vaccination record if obtained from local pharmacy or Health Dept. Verbalized acceptance and understanding.  Covid-19 vaccine status: Completed vaccines  Qualifies for Shingles Vaccine? Yes   Zostavax completed No   Shingrix Completed?: No.    Education has been provided regarding the importance of  this vaccine. Patient has been advised to call insurance company to determine out of pocket expense if they have not yet received this vaccine. Advised may also receive vaccine at local pharmacy or Health Dept. Verbalized acceptance and understanding.  Screening Tests Health Maintenance  Topic Date Due   Hepatitis C Screening  Never done   COLONOSCOPY (Pts 45-13yrs Insurance coverage will need to be confirmed)  12/07/2012   PNA vac Low Risk Adult (2 of 2 - PPSV23) 03/13/2018   COVID-19 Vaccine (3 - Booster for Pfizer series) 08/16/2020   TETANUS/TDAP  10/20/2024   INFLUENZA VACCINE  Completed    Health Maintenance  Health Maintenance Due  Topic Date Due   Hepatitis C Screening  Never done   COLONOSCOPY (Pts 45-75yrs Insurance coverage will need to be confirmed)  12/07/2012   PNA vac Low Risk Adult (2 of 2 - PPSV23) 03/13/2018   COVID-19 Vaccine (3 - Booster for Pfizer series) 08/16/2020    Colorectal cancer screening: Type of screening: Colonoscopy. Completed 09/29/2011. Repeat every 10 years  Lung Cancer Screening: (Low Dose CT Chest recommended if Age 13-80 years, 30 pack-year currently smoking OR have quit w/in 15years.) does not qualify.   Lung Cancer Screening Referral: no  Additional Screening:  Hepatitis C Screening: does qualify; Completed no  Vision Screening: Recommended annual ophthalmology exams for early detection of glaucoma and other disorders of the eye. Is the patient up to date with their annual eye exam?  Yes  Who is the provider or what is the name of the office in which the patient attends annual eye exams? Madilyn Hook, OD. If pt is not established with a provider, would they like to be referred to a provider to establish care? No .   Dental Screening: Recommended annual dental exams for proper oral hygiene  Community Resource Referral / Chronic Care Management: CRR required this visit?  No   CCM required this visit?  No      Plan:     I have  personally reviewed and noted the following in the patient's chart:   Medical and social history Use of alcohol, tobacco or illicit drugs  Current medications and supplements Functional ability and status Nutritional status Physical activity Advanced directives List of other physicians Hospitalizations, surgeries, and ER visits in previous 61  months Vitals Screenings to include cognitive, depression, and falls Referrals and appointments  In addition, I have reviewed and discussed with patient certain preventive protocols, quality metrics, and best practice recommendations. A written personalized care plan for preventive services as well as general preventive health recommendations were provided to patient.     Sheral Flow, LPN   [id number]/4650   Nurse Notes: n/a  Medical screening examination/treatment/procedure(s) were performed by non-physician practitioner and as supervising physician I was immediately available for consultation/collaboration.  I agree with above. Lew Dawes, MD

## 2020-12-25 NOTE — Assessment & Plan Note (Signed)
Discussed Psychology referral

## 2020-12-25 NOTE — Patient Instructions (Addendum)
Jorge Rogers

## 2021-01-23 ENCOUNTER — Ambulatory Visit: Payer: Medicare HMO | Admitting: Psychology

## 2021-08-07 ENCOUNTER — Other Ambulatory Visit: Payer: Self-pay | Admitting: *Deleted

## 2021-08-07 MED ORDER — ROSUVASTATIN CALCIUM 5 MG PO TABS
5.0000 mg | ORAL_TABLET | Freq: Every day | ORAL | 3 refills | Status: DC
Start: 2021-08-07 — End: 2022-06-16

## 2021-09-04 ENCOUNTER — Other Ambulatory Visit: Payer: Self-pay

## 2021-09-05 ENCOUNTER — Encounter: Payer: Self-pay | Admitting: Internal Medicine

## 2021-09-05 ENCOUNTER — Ambulatory Visit (INDEPENDENT_AMBULATORY_CARE_PROVIDER_SITE_OTHER): Payer: Medicare HMO | Admitting: Internal Medicine

## 2021-09-05 VITALS — BP 118/80 | HR 55 | Ht 71.0 in | Wt 202.0 lb

## 2021-09-05 DIAGNOSIS — Z0000 Encounter for general adult medical examination without abnormal findings: Secondary | ICD-10-CM | POA: Diagnosis not present

## 2021-09-05 DIAGNOSIS — I2583 Coronary atherosclerosis due to lipid rich plaque: Secondary | ICD-10-CM | POA: Diagnosis not present

## 2021-09-05 DIAGNOSIS — N183 Chronic kidney disease, stage 3 unspecified: Secondary | ICD-10-CM

## 2021-09-05 DIAGNOSIS — R1319 Other dysphagia: Secondary | ICD-10-CM

## 2021-09-05 DIAGNOSIS — R1310 Dysphagia, unspecified: Secondary | ICD-10-CM | POA: Insufficient documentation

## 2021-09-05 DIAGNOSIS — I2510 Atherosclerotic heart disease of native coronary artery without angina pectoris: Secondary | ICD-10-CM

## 2021-09-05 DIAGNOSIS — Z1211 Encounter for screening for malignant neoplasm of colon: Secondary | ICD-10-CM | POA: Diagnosis not present

## 2021-09-05 LAB — CBC WITH DIFFERENTIAL/PLATELET
Basophils Absolute: 0 10*3/uL (ref 0.0–0.1)
Basophils Relative: 0.4 % (ref 0.0–3.0)
Eosinophils Absolute: 0.2 10*3/uL (ref 0.0–0.7)
Eosinophils Relative: 3.2 % (ref 0.0–5.0)
HCT: 43.5 % (ref 39.0–52.0)
Hemoglobin: 14.7 g/dL (ref 13.0–17.0)
Lymphocytes Relative: 36.7 % (ref 12.0–46.0)
Lymphs Abs: 1.9 10*3/uL (ref 0.7–4.0)
MCHC: 33.7 g/dL (ref 30.0–36.0)
MCV: 94.5 fl (ref 78.0–100.0)
Monocytes Absolute: 0.6 10*3/uL (ref 0.1–1.0)
Monocytes Relative: 11.4 % (ref 3.0–12.0)
Neutro Abs: 2.5 10*3/uL (ref 1.4–7.7)
Neutrophils Relative %: 48.3 % (ref 43.0–77.0)
Platelets: 205 10*3/uL (ref 150.0–400.0)
RBC: 4.61 Mil/uL (ref 4.22–5.81)
RDW: 13.3 % (ref 11.5–15.5)
WBC: 5.2 10*3/uL (ref 4.0–10.5)

## 2021-09-05 LAB — COMPREHENSIVE METABOLIC PANEL
ALT: 15 U/L (ref 0–53)
AST: 21 U/L (ref 0–37)
Albumin: 4.2 g/dL (ref 3.5–5.2)
Alkaline Phosphatase: 59 U/L (ref 39–117)
BUN: 14 mg/dL (ref 6–23)
CO2: 29 mEq/L (ref 19–32)
Calcium: 9.4 mg/dL (ref 8.4–10.5)
Chloride: 104 mEq/L (ref 96–112)
Creatinine, Ser: 1.34 mg/dL (ref 0.40–1.50)
GFR: 53.81 mL/min — ABNORMAL LOW (ref >60.00)
Glucose, Bld: 86 mg/dL (ref 70–99)
Potassium: 4.3 mEq/L (ref 3.5–5.1)
Sodium: 138 mEq/L (ref 135–145)
Total Bilirubin: 0.7 mg/dL (ref 0.2–1.2)
Total Protein: 6.4 g/dL (ref 6.0–8.3)

## 2021-09-05 LAB — LIPID PANEL
Cholesterol: 130 mg/dL (ref 0–200)
HDL: 55.4 mg/dL (ref >39.00)
LDL Cholesterol: 61 mg/dL (ref 0–99)
NonHDL: 74.64
Total CHOL/HDL Ratio: 2
Triglycerides: 68 mg/dL (ref 0.0–149.0)
VLDL: 13.6 mg/dL (ref 0.0–40.0)

## 2021-09-05 LAB — BASIC METABOLIC PANEL
BUN: 14 mg/dL (ref 6–23)
CO2: 29 mEq/L (ref 19–32)
Calcium: 9.4 mg/dL (ref 8.4–10.5)
Chloride: 104 mEq/L (ref 96–112)
Creatinine, Ser: 1.34 mg/dL (ref 0.40–1.50)
GFR: 53.81 mL/min — ABNORMAL LOW (ref >60.00)
Glucose, Bld: 86 mg/dL (ref 70–99)
Potassium: 4.3 mEq/L (ref 3.5–5.1)
Sodium: 138 mEq/L (ref 135–145)

## 2021-09-05 LAB — PSA: PSA: 1.44 ng/mL (ref 0.10–4.00)

## 2021-09-05 LAB — TSH: TSH: 1.67 u[IU]/mL (ref 0.35–5.50)

## 2021-09-05 NOTE — Assessment & Plan Note (Signed)
We discussed age appropriate health related issues, including available/recomended screening tests and vaccinations. Labs were ordered to be later reviewed . All questions were answered. We discussed one or more of the following - seat belt use, use of sunscreen/sun exposure exercise, safe sex, fall risk reduction, second hand smoke exposure, firearm use and storage, seat belt use, a need for adhering to healthy diet and exercise. Labs were ordered.  All questions were answered.  Cor calc CT score is 4 --- 10/21 Due colon 2022

## 2021-09-05 NOTE — Addendum Note (Signed)
Addended by: Jacobo Forest on: 09/05/2021 08:31 AM   Modules accepted: Orders

## 2021-09-05 NOTE — Progress Notes (Signed)
Subjective:  Patient ID: Jorge Rogers, male    DOB: July 07, 1951  Age: 70 y.o. MRN: [id number]  CC: Annual Exam   HPI [name] presents for a well exam C/o GERD/dysphagia at times  Outpatient Medications Prior to Visit  Medication Sig Dispense Refill   aspirin 81 MG tablet Take 81 mg by mouth daily.     Cholecalciferol (VITAMIN D3) 50 MCG (2000 UT) capsule Take 1 capsule (2,000 Units total) by mouth daily. 100 capsule 3   omeprazole (PRILOSEC) 20 MG capsule Take 20 mg by mouth daily.     rosuvastatin (CRESTOR) 5 MG tablet Take 1 tablet (5 mg total) by mouth daily. 90 tablet 3   triamcinolone ointment (KENALOG) 0.1 % Apply 1 application topically 2 (two) times daily. 80 g 2   No facility-administered medications prior to visit.    ROS: Review of Systems  Constitutional:  Negative for appetite change, fatigue and unexpected weight change.  HENT:  Negative for congestion, nosebleeds, sneezing, sore throat and trouble swallowing.   Eyes:  Negative for itching and visual disturbance.  Respiratory:  Negative for cough.   Cardiovascular:  Negative for chest pain, palpitations and leg swelling.  Gastrointestinal:  Negative for abdominal distention, blood in stool, diarrhea and nausea.  Genitourinary:  Negative for frequency and hematuria.  Musculoskeletal:  Negative for back pain, gait problem, joint swelling and neck pain.  Skin:  Negative for rash.  Neurological:  Negative for dizziness, tremors, speech difficulty and weakness.  Psychiatric/Behavioral:  Negative for agitation, dysphoric mood and sleep disturbance. The patient is not nervous/anxious.    Objective:  BP 118/80 (BP Location: Left Arm)   Pulse (!) 55   Ht 5\' 11"  (1.803 m)   Wt 202 lb (91.6 kg)   SpO2 98%   BMI 28.17 kg/m   BP Readings from Last 3 Encounters:  09/05/21 118/80  12/25/20 110/70  12/25/20 110/70    Wt Readings from Last 3 Encounters:  09/05/21 202 lb (91.6 kg)  12/25/20 202 lb 9.6 oz (91.9  kg)  12/25/20 202 lb 9.6 oz (91.9 kg)    Physical Exam Constitutional:      General: He is not in acute distress.    Appearance: Normal appearance. He is well-developed.     Comments: NAD  Eyes:     Conjunctiva/sclera: Conjunctivae normal.     Pupils: Pupils are equal, round, and reactive to light.  Neck:     Thyroid: No thyromegaly.     Vascular: No JVD.  Cardiovascular:     Rate and Rhythm: Normal rate and regular rhythm.     Heart sounds: Normal heart sounds. No murmur heard.   No friction rub. No gallop.  Pulmonary:     Effort: Pulmonary effort is normal. No respiratory distress.     Breath sounds: Normal breath sounds. No wheezing or rales.  Chest:     Chest wall: No tenderness.  Abdominal:     General: Bowel sounds are normal. There is no distension.     Palpations: Abdomen is soft. There is no mass.     Tenderness: There is no abdominal tenderness. There is no guarding or rebound.  Musculoskeletal:        General: No tenderness. Normal range of motion.     Cervical back: Normal range of motion.  Lymphadenopathy:     Cervical: No cervical adenopathy.  Skin:    General: Skin is warm and dry.     Findings: No rash.  Neurological:     Mental Status: He is alert and oriented to person, place, and time.     Cranial Nerves: No cranial nerve deficit.     Motor: No abnormal muscle tone.     Coordination: Coordination normal.     Gait: Gait normal.     Deep Tendon Reflexes: Reflexes are normal and symmetric.  Psychiatric:        Behavior: Behavior normal.        Thought Content: Thought content normal.        Judgment: Judgment normal.  Rectal - per GI  Lab Results  Component Value Date   WBC 5.7 09/10/2020   HGB 15.6 09/10/2020   HCT 45.2 09/10/2020   PLT 206.0 09/10/2020   GLUCOSE 84 12/25/2020   CHOL 141 12/25/2020   TRIG 54.0 12/25/2020   HDL 63.70 12/25/2020   LDLCALC 67 12/25/2020   ALT 18 12/25/2020   AST 21 12/25/2020   NA 136 12/25/2020   K 4.3  12/25/2020   CL 102 12/25/2020   CREATININE 1.27 12/25/2020   BUN 19 12/25/2020   CO2 28 12/25/2020   TSH 2.77 09/10/2020   PSA 1.37 09/10/2020   INR 2.2 RATIO (H) 07/15/2007   MICROALBUR 35.7 Repeated and verified X2. (H) 06/20/2013    CT CARDIAC SCORING  Addendum Date: 09/23/2020   ADDENDUM REPORT: 09/23/2020 22:22 CLINICAL DATA:  Risk stratification EXAM: Coronary Calcium Score TECHNIQUE: The patient was scanned on a [employer]. Axial non-contrast 3 mm slices were carried out through the heart. The data set was analyzed on a dedicated work station and scored using the [employer]. FINDINGS: Non-cardiac: See separate report from [facility]. Ascending Aorta: Normal caliber.  No calcifications. Pericardium: Normal Coronary arteries: Normal coronary origins. IMPRESSION: Coronary calcium score of 4. This was 37th percentile for age and sex matched control. Fransico Him Electronically Signed   By: Fransico Him   On: 09/23/2020 22:22   Result Date: 09/23/2020 EXAM: OVER-READ INTERPRETATION  CT CHEST The following report is an over-read performed by radiologist Dr. Aletta Edouard of [facility], [place] on 09/21/2020. This over-read does not include interpretation of cardiac or coronary anatomy or pathology. The coronary calcium score interpretation by the cardiologist is attached. COMPARISON:  CTA of the chest 07/07/2007 FINDINGS: Vascular: There is mild dilatation of the visualized proximal descending thoracic aorta measuring up to approximately 3.5 cm and estimated diameter by unenhanced CT. On the prior CTA in 2008, the same segment measured approximately 3 cm. The rest of the visualized thoracic aorta is not overtly aneurysmal although the lower descending thoracic aorta is borderline in size measuring 3-3.1 cm. Further correlation with follow-up elective CTA of the chest with contrast would be helpful. Mediastinum/Nodes: Visualized mediastinum and hilar regions  demonstrate no lymphadenopathy or abnormal masses. Lungs/Pleura: Visualized lungs show no evidence of pulmonary edema, consolidation, pneumothorax, nodule or pleural fluid. Upper Abdomen: No acute abnormality. Musculoskeletal: No chest wall mass or suspicious bone lesions identified. 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 On the prior CTA in 2008, the same segment measured approximately 3 cm. Further correlation with follow-up elective CTA of the chest with contrast would be helpful. Electronically Signed: By: Aletta Edouard M.D. On: 09/21/2020 09:14    Assessment & Plan:   Problem List Items Addressed This Visit     Coronary atherosclerosis due to lipid rich plaque  On Crestor low dose      Dysphagia    Cont w/Prilosec GI ref - Dr Henrene Pastor      Relevant Orders   Ambulatory referral to Gastroenterology   Well adult exam    We discussed age appropriate health related issues, including available/recomended screening tests and vaccinations. Labs were ordered to be later reviewed . All questions were answered. We discussed one or more of the following - seat belt use, use of sunscreen/sun exposure exercise, safe sex, fall risk reduction, second hand smoke exposure, firearm use and storage, seat belt use, a need for adhering to healthy diet and exercise. Labs were ordered.  All questions were answered.  Cor calc CT score is 4 --- 10/21 Due colon 2022      Relevant Orders   TSH   CBC with Differential/Platelet   Lipid panel   Comprehensive metabolic panel   PSA   Basic metabolic panel   Other Visit Diagnoses     Colon cancer screening    -  Primary   Relevant Orders   Ambulatory referral to Gastroenterology   TSH   CBC with Differential/Platelet   Lipid panel   Comprehensive metabolic panel   PSA   Basic metabolic panel         Follow-up: Return in about 1  year (around 09/05/2022) for Wellness Exam.  Walker Kehr, MD

## 2021-09-05 NOTE — Assessment & Plan Note (Signed)
Cont w/Prilosec GI ref - Dr Henrene Pastor

## 2021-09-05 NOTE — Assessment & Plan Note (Signed)
On Crestor low dose

## 2021-09-06 DIAGNOSIS — N183 Chronic kidney disease, stage 3 unspecified: Secondary | ICD-10-CM | POA: Insufficient documentation

## 2021-09-06 NOTE — Addendum Note (Signed)
Addended by: Cassandria Anger on: 09/06/2021 02:44 PM   Modules accepted: Orders

## 2021-09-06 NOTE — Assessment & Plan Note (Signed)
GFR 53, stable Obtain renal ultrasound Good hydration Monitor GFR

## 2021-09-12 DIAGNOSIS — H524 Presbyopia: Secondary | ICD-10-CM | POA: Diagnosis not present

## 2021-09-16 ENCOUNTER — Telehealth: Payer: Self-pay | Admitting: Internal Medicine

## 2021-09-16 NOTE — Telephone Encounter (Signed)
Patient says he was not aware of the referral for the ultrasound  Wants to know why the ultrasound is necessary  Please call [phone]

## 2021-09-19 NOTE — Telephone Encounter (Signed)
Called pt inform him why U/S was ordered. Pt states he didn't look at the results on mychart. Gave him MD response on the labs.Marland KitchenJohny Chess

## 2021-09-20 ENCOUNTER — Encounter: Payer: Self-pay | Admitting: Internal Medicine

## 2021-09-24 ENCOUNTER — Ambulatory Visit
Admission: RE | Admit: 2021-09-24 | Discharge: 2021-09-24 | Disposition: A | Discharge status: Home / Self Care | Payer: Medicare HMO | Source: Ambulatory Visit | Attending: Internal Medicine | Admitting: Internal Medicine

## 2021-09-24 DIAGNOSIS — N183 Chronic kidney disease, stage 3 unspecified: Secondary | ICD-10-CM

## 2021-09-24 DIAGNOSIS — N189 Chronic kidney disease, unspecified: Secondary | ICD-10-CM | POA: Diagnosis not present

## 2021-09-24 DIAGNOSIS — N2889 Other specified disorders of kidney and ureter: Secondary | ICD-10-CM | POA: Diagnosis not present

## 2021-10-17 ENCOUNTER — Ambulatory Visit: Payer: Medicare HMO | Admitting: Internal Medicine

## 2021-10-17 ENCOUNTER — Encounter: Payer: Self-pay | Admitting: Internal Medicine

## 2021-10-17 VITALS — BP 114/76 | HR 70 | Ht 71.0 in | Wt 206.1 lb

## 2021-10-17 DIAGNOSIS — Z1211 Encounter for screening for malignant neoplasm of colon: Secondary | ICD-10-CM

## 2021-10-17 DIAGNOSIS — K2100 Gastro-esophageal reflux disease with esophagitis, without bleeding: Secondary | ICD-10-CM | POA: Diagnosis not present

## 2021-10-17 DIAGNOSIS — K222 Esophageal obstruction: Secondary | ICD-10-CM | POA: Diagnosis not present

## 2021-10-17 DIAGNOSIS — R1319 Other dysphagia: Secondary | ICD-10-CM

## 2021-10-17 MED ORDER — SUTAB 1479-225-188 MG PO TABS: 1.0000 | ORAL_TABLET | Freq: Once | ORAL | 0 refills | Status: AC

## 2021-10-17 NOTE — Patient Instructions (Signed)
If you are age 70 or older, your body mass index should be between 23-30. Your Body mass index is 28.75 kg/m. If this is out of the aforementioned range listed, please consider follow up with your Primary Care Provider.  If you are age 50 or younger, your body mass index should be between 19-25. Your Body mass index is 28.75 kg/m. If this is out of the aformentioned range listed, please consider follow up with your Primary Care Provider.   ________________________________________________________  The [place] [facility] providers would like to encourage you to use [facility] to communicate with providers for non-urgent requests or questions.  Due to long hold times on the telephone, sending your provider a message by [facility] may be a faster and more efficient way to get a response.  Please allow 48 business hours for a response.  Please remember that this is for non-urgent requests.  _______________________________________________________  Jorge Rogers have been scheduled for an endoscopy and colonoscopy. Please follow the written instructions given to you at your visit today. Please pick up your prep supplies at the pharmacy within the next 1-3 days. If you use inhalers (even only as needed), please bring them with you on the day of your procedure.

## 2021-10-17 NOTE — Progress Notes (Signed)
HISTORY OF PRESENT ILLNESS:  Jorge Rogers is a 70 y.o. male with past medical history as listed below who presents today regarding the need for follow-up screening colonoscopy and dysphagia.  The patient underwent colonoscopy in 2004 and again in October 2012.  He was found to have left-sided diverticulosis but no colorectal neoplasia.  Follow-up in 10 years recommended.  Patient also has a history of chronic GERD for which she takes omeprazole daily.  Off medication he will experience reflux symptoms.  He does not take his medicine daily.  He did undergo upper endoscopy January 2004.  He was found to have a distal esophageal stricture as well as reflux esophagitis.  No dilation at that time.  He reports to me history of intermittent solid food dysphagia.  Can occur when not chewing foods well.  His GI review of systems is otherwise negative.  Review of blood work from September 05, 2021 shows unremarkable comprehensive metabolic panel.  Normal liver test.  Normal CBC with hemoglobin 14.7.  REVIEW OF SYSTEMS:  All non-GI ROS negative unless otherwise stated in the HPI except for urinary leakage  Past Medical History:  Diagnosis Date   Pulmonary embolism ([place]) 07/2007   B PE and RLE DVT    Past Surgical History:  Procedure Laterality Date   ACHILLES TENDON REPAIR      Social History Jorge Rogers  reports that he has never smoked. He has never used smokeless tobacco. He reports current alcohol use. He reports that he does not use drugs.  family history includes Heart disease (age of onset: 75) in his father.  No Known Allergies     PHYSICAL EXAMINATION: Vital signs: BP 114/76   Pulse 70   Ht 5\' 11"  (1.803 m)   Wt 206 lb 2 oz (93.5 kg)   BMI 28.75 kg/m   Constitutional: generally well-appearing, no acute distress Psychiatric: alert and oriented x3, cooperative Eyes: extraocular movements intact, anicteric, conjunctiva pink Mouth: Mask Neck: supple no lymphadenopathy Cardiovascular:  heart regular rate and rhythm, no murmur Lungs: clear to auscultation bilaterally Abdomen: soft, nontender, nondistended, no obvious ascites, no peritoneal signs, normal bowel sounds, no organomegaly Rectal: Deferred till colonoscopy Extremities: no clubbing, cyanosis, or lower extremity edema bilaterally Skin: no lesions on visible extremities Neuro: No focal deficits.  Cranial nerves intact  ASSESSMENT:  1.  Chronic GERD.  Requires PPI to control symptoms. 2.  Esophageal stricture on previous EGD. 3.  Intermittent solid food dysphagia likely secondary to known peptic stricture. 4.  Previous colonoscopy 2004, 2012.  Diverticulosis.  No neoplasia.  Due for follow-up screening.  Motivated.  No active complaints   PLAN:  1.  Reflux precautions 2.  Continue omeprazole 20 mg daily 3.  Schedule upper endoscopy with esophageal dilation.The nature of the procedure, as well as the risks, benefits, and alternatives were carefully and thoroughly reviewed with the patient. Ample time for discussion and questions allowed. The patient understood, was satisfied, and agreed to proceed.  4.  Schedule screening colonoscopy.The nature of the procedure, as well as the risks, benefits, and alternatives were carefully and thoroughly reviewed with the patient. Ample time for discussion and questions allowed. The patient understood, was satisfied, and agreed to proceed.

## 2021-10-25 ENCOUNTER — Encounter: Payer: Self-pay | Admitting: Internal Medicine

## 2021-12-09 ENCOUNTER — Telehealth: Payer: Self-pay | Admitting: Internal Medicine

## 2021-12-09 NOTE — Telephone Encounter (Signed)
Hey Dr Henrene Pastor, this pt's colonoscopy was not approved by [employer], I can start an appeal process but that will take some time, please advise on whether you would like to appeal the decision however the pt's endoscopy was approved, please let me know what you would like to do, thank you!

## 2021-12-09 NOTE — Telephone Encounter (Signed)
Hey Dr Henrene Pastor, this pt's colonoscopy was not approved by [employer], I can start an appeal process but that will take some time, please adviseon [place]

## 2021-12-09 NOTE — Telephone Encounter (Signed)
noted 

## 2021-12-09 NOTE — Telephone Encounter (Signed)
Hey Dr Henrene Pastor, this pt decided to go ahead and cancel his colonoscopy and egd scheduled for tomorrow since his insurance did not approve the colonoscopy, he said he would wait and reschedule.

## 2021-12-10 ENCOUNTER — Encounter: Payer: Medicare HMO | Admitting: Internal Medicine

## 2021-12-26 ENCOUNTER — Other Ambulatory Visit: Payer: Self-pay

## 2021-12-26 ENCOUNTER — Ambulatory Visit (INDEPENDENT_AMBULATORY_CARE_PROVIDER_SITE_OTHER): Payer: Medicare HMO

## 2021-12-26 VITALS — BP 118/70 | HR 73 | Temp 98.0°F | Ht 71.0 in | Wt 201.4 lb

## 2021-12-26 DIAGNOSIS — Z0000 Encounter for general adult medical examination without abnormal findings: Secondary | ICD-10-CM | POA: Diagnosis not present

## 2021-12-26 NOTE — Progress Notes (Addendum)
Subjective:   Jorge Rogers is a 71 y.o. male who presents for Medicare Annual/Subsequent preventive examination.  Review of Systems     Cardiac Risk Factors include: advanced age (>57men, >61 women);dyslipidemia;male gender;family history of premature cardiovascular disease     Objective:    Today's Vitals   12/26/21 0930  BP: 118/70  Pulse: 73  Temp: 98 F (36.7 C)  SpO2: 98%  Weight: 201 lb 6.4 oz (91.4 kg)  Height: 5\' 11"  (1.803 m)  PainSc: 0-No pain   Body mass index is 28.09 kg/m.  Advanced Directives 12/26/2021 12/25/2020 11/29/2014  Does Patient Have a Medical Advance Directive? No No No  Type of Paramedic of [place];Living will - -  Does patient want to make changes to medical advance directive? Yes (MAU/Ambulatory/Procedural Areas - Information given) - -  Would patient like information on creating a medical advance directive? - No - Patient declined No - patient declined information    Current Medications (verified) Outpatient Encounter Medications as of 12/26/2021  Medication Sig   aspirin 81 MG tablet Take 81 mg by mouth daily.   Cholecalciferol (VITAMIN D3) 50 MCG (2000 UT) capsule Take 1 capsule (2,000 Units total) by mouth daily.   omeprazole (PRILOSEC) 20 MG capsule Take 20 mg by mouth daily.   rosuvastatin (CRESTOR) 5 MG tablet Take 1 tablet (5 mg total) by mouth daily.   triamcinolone ointment (KENALOG) 0.1 % Apply 1 application topically 2 (two) times daily.   No facility-administered encounter medications on file as of 12/26/2021.    Allergies (verified) Patient has no known allergies.   History: Past Medical History:  Diagnosis Date   Pulmonary embolism ([place]) 07/2007   B PE and RLE DVT   Past Surgical History:  Procedure Laterality Date   ACHILLES TENDON REPAIR     Family History  Problem Relation Age of Onset   Heart disease Father 83       card arrest   Colon cancer Neg Hx    Social History   Socioeconomic  History   Marital status: Married    Spouse name: Not on file   Number of children: 3   Years of education: Not on file   Highest education level: Not on file  Occupational History   Not on file  Tobacco Use   Smoking status: Never   Smokeless tobacco: Never  Vaping Use   Vaping Use: Never used  Substance and Sexual Activity   Alcohol use: Yes   Drug use: No   Sexual activity: Yes  Other Topics Concern   Not on file  Social History Narrative   Not on file   Social Determinants of Health   Financial Resource Strain: Low Risk    Difficulty of Paying Living Expenses: Not hard at all  Food Insecurity: No Food Insecurity   Worried About Charity fundraiser in the Last Year: Never true   [place] in the Last Year: Never true  Transportation Needs: No Transportation Needs   Lack of Transportation (Medical): No   Lack of Transportation (Non-Medical): No  Physical Activity: Sufficiently Active   Days of Exercise per Week: 3 days   Minutes of Exercise per Session: 60 min  Stress: No Stress Concern Present   Feeling of Stress : Not at all  Social Connections: Socially Integrated   Frequency of Communication with Friends and Family: More than three times a week   Frequency of Social Gatherings with  Friends and Family: Once a week   Attends Religious Services: More than 4 times per year   Active Member of Clubs or Organizations: Yes   Attends Music therapist: More than 4 times per year   Marital Status: Married    Tobacco Counseling Counseling given: Not Answered   Clinical Intake:  Pre-visit preparation completed: Yes  Pain : No/denies pain Pain Score: 0-No pain     BMI - recorded: 28.09 Nutritional Status: BMI 25 -29 Overweight Nutritional Risks: None Diabetes: No  How often do you need to have someone help you when you read instructions, pamphlets, or other written materials from your doctor or pharmacy?: 1 - Never  Diabetic?  no  Interpreter Needed?: No  Information entered by :: Lisette Abu, LPN   Activities of Daily Living In your present state of health, do you have any difficulty performing the following activities: 12/26/2021  Hearing? N  Vision? N  Difficulty concentrating or making decisions? N  Walking or climbing stairs? N  Dressing or bathing? N  Doing errands, shopping? N  Preparing Food and eating ? N  Using the Toilet? N  In the past six months, have you accidently leaked urine? N  Do you have problems with loss of bowel control? N  Managing your Medications? N  Managing your Finances? N  Housekeeping or managing your Housekeeping? N  Some recent data might be hidden    Patient Care Team: Plotnikov, Evie Lacks, MD as PCP - General (Internal Medicine) Madilyn Hook, [place] as Consulting Physician (Optometry) Irene Shipper, MD as Consulting Physician (Gastroenterology)  Indicate any recent Medical Services you may have received from other than [facility] providers in the past year (date may be approximate).     Assessment:   This is a routine wellness examination for Jorge Rogers.  Hearing/Vision screen Hearing Screening - Comments:: Patient denied any hearing difficulty.   No hearing aids.  Vision Screening - Comments:: Patient wears corrective glasses/contacts.  Eye exam done annually by: Madilyn Hook, OD.  Dietary issues and exercise activities discussed: Current Exercise Habits: Structured exercise class, Time (Minutes): 60, Frequency (Times/Week): 3, Weekly Exercise (Minutes/Week): 180, Intensity: Moderate, Exercise limited by: orthopedic condition(s)   Goals Addressed               This Visit's Progress     Patient Stated (pt-stated)        To maintain my current health status by continuing to eat healthy, stay physically active and socially active.      Depression Screen PHQ 2/9 Scores 12/26/2021 12/25/2020 12/25/2020 09/04/2020 01/29/2018  PHQ - 2 Score 0 0 0 0 0    Fall  Risk Fall Risk  12/26/2021 12/25/2020 12/25/2020 09/04/2020 01/29/2018  Falls in the past year? 0 0 0 0 No  Number falls in past yr: 0 0 0 0 -  Injury with Fall? 0 0 0 0 -  Risk for fall due to : No Fall Risks No Fall Risks - - -  Follow up Falls evaluation completed - - - -    FALL RISK PREVENTION PERTAINING TO THE HOME:  Any stairs in or around the home? Yes  If so, are there any without handrails? No  Home free of loose throw rugs in walkways, pet beds, electrical cords, etc? Yes  Adequate lighting in your home to reduce risk of falls? Yes   ASSISTIVE DEVICES UTILIZED TO PREVENT FALLS:  Life alert? No  Use of a cane, walker or  w/c? No  Grab bars in the bathroom? Yes  Shower chair or bench in shower? No  Elevated toilet seat or a handicapped toilet? No   TIMED UP AND GO:  Was the test performed? Yes .  Length of time to ambulate 10 feet: 6 sec.   Gait steady and fast without use of assistive device  Cognitive Function: Normal cognitive status assessed by direct observation by this Nurse Health Advisor. No abnormalities found.          Immunizations Immunization History  Administered Date(s) Administered   Influenza-Unspecified 11/01/2020   PFIZER(Purple Top)SARS-COV-2 Vaccination 01/22/2020, 02/14/2020   Pneumococcal Conjugate-13 03/13/2017   Tdap 12/22/2012   Tetanus 10/20/2014    TDAP status: Up to date  Flu Vaccine status: Up to date  Pneumococcal vaccine status: Due, Education has been provided regarding the importance of this vaccine. Advised may receive this vaccine at local pharmacy or Health Dept. Aware to provide a copy of the vaccination record if obtained from local pharmacy or Health Dept. Verbalized acceptance and understanding.  Covid-19 vaccine status: Completed vaccines  Qualifies for Shingles Vaccine? Yes   Zostavax completed No   Shingrix Completed?: No.    Education has been provided regarding the importance of this vaccine. Patient has been  advised to call insurance company to determine out of pocket expense if they have not yet received this vaccine. Advised may also receive vaccine at local pharmacy or Health Dept. Verbalized acceptance and understanding.  Screening Tests Health Maintenance  Topic Date Due   Hepatitis C Screening  Never done   Zoster Vaccines- Shingrix (1 of 2) Never done   COLONOSCOPY (Pts 45-40yrs Insurance coverage will need to be confirmed)  12/07/2012   Pneumonia Vaccine 58+ Years old (2 - PPSV23 if available, else PCV20) 03/13/2018   COVID-19 Vaccine (3 - Booster for Pfizer series) 04/10/2020   TETANUS/TDAP  10/20/2024   HPV VACCINES  Aged Out   INFLUENZA VACCINE  Discontinued    Health Maintenance  Health Maintenance Due  Topic Date Due   Hepatitis C Screening  Never done   Zoster Vaccines- Shingrix (1 of 2) Never done   COLONOSCOPY (Pts 45-25yrs Insurance coverage will need to be confirmed)  12/07/2012   Pneumonia Vaccine 59+ Years old (2 - PPSV23 if available, else PCV20) 03/13/2018   COVID-19 Vaccine (3 - Booster for [place] series) 04/10/2020    Colorectal cancer screening: Referral to GI placed and scheduled for 01/28/2022. Pt aware the office will call re: appt.  Lung Cancer Screening: (Low Dose CT Chest recommended if Age 47-80 years, 30 pack-year currently smoking OR have quit w/in 15years.) does not qualify.   Lung Cancer Screening Referral: no  Additional Screening:  Hepatitis C Screening: does not qualify; Completed no  Vision Screening: Recommended annual ophthalmology exams for early detection of glaucoma and other disorders of the eye. Is the patient up to date with their annual eye exam?  Yes  Who is the provider or what is the name of the office in which the patient attends annual eye exams? Madilyn Hook, OD. If pt is not established with a provider, would they like to be referred to a provider to establish care? No .   Dental Screening: Recommended annual dental exams for  proper oral hygiene  Community Resource Referral / Chronic Care Management: CRR required this visit?  No   CCM required this visit?  No      Plan:     I have personally  reviewed and noted the following in the patients chart:   Medical and social history Use of alcohol, tobacco or illicit drugs  Current medications and supplements including opioid prescriptions. Patient is not currently taking opioid prescriptions. Functional ability and status Nutritional status Physical activity Advanced directives List of other physicians Hospitalizations, surgeries, and ER visits in previous 12 months Vitals Screenings to include cognitive, depression, and falls Referrals and appointments  In addition, I have reviewed and discussed with patient certain preventive protocols, quality metrics, and best practice recommendations. A written personalized care plan for preventive services as well as general preventive health recommendations were provided to patient.     Sheral Flow, LPN   05/30/5283   Nurse Notes:  Hearing Screening - Comments:: Patient denied any hearing difficulty.   No hearing aids.  Vision Screening - Comments:: Patient wears corrective glasses/contacts.  Eye exam done annually by: Madilyn Hook, OD.   Medical screening examination/treatment/procedure(s) were performed by non-physician practitioner and as supervising physician I was immediately available for consultation/collaboration.  I agree with above. Lew Dawes, MD

## 2021-12-26 NOTE — Patient Instructions (Signed)
Jorge Rogers , Thank you for taking time to come for your Medicare Wellness Visit. I appreciate your ongoing commitment to your health goals. Please review the following plan we discussed and let me know if I can assist you in the future.   Screening recommendations/referrals: Colonoscopy: scheduled for 01/28/2022 Recommended yearly ophthalmology/optometry visit for glaucoma screening and checkup Recommended yearly dental visit for hygiene and checkup  Vaccinations: Influenza vaccine: 10/2021 Pneumococcal vaccine: 03/13/2017 Tdap vaccine: 12/22/2012; due every 10 years Shingles vaccine: never done   Covid-19: 01/22/2020, 02/14/2020, 11/01/2020  Advanced directives: Advance directive discussed with you today. I have provided a copy for you to complete at home and have notarized. Once this is complete please bring a copy in to our office so we can scan it into your chart.  Conditions/risks identified: Yes; Client understands the importance of follow-up with providers by attending scheduled visits and discussed goals to eat healthier, increase physical activity, exercise the brain, socialize more, get enough sleep and make time for laughter.  Next appointment: Please schedule your next Medicare Wellness Visit with your Nurse Health Advisor in 1 year by calling [phone].  Preventive Care 60 Years and Older, Male Preventive care refers to lifestyle choices and visits with your health care provider that can promote health and wellness. What does preventive care include? A yearly physical exam. This is also called an annual well check. Dental exams once or twice a year. Routine eye exams. Ask your health care provider how often you should have your eyes checked. Personal lifestyle choices, including: Daily care of your teeth and gums. Regular physical activity. Eating a healthy diet. Avoiding tobacco and drug use. Limiting alcohol use. Practicing safe sex. Taking low doses of aspirin every  day. Taking vitamin and mineral supplements as recommended by your health care provider. What happens during an annual well check? The services and screenings done by your health care provider during your annual well check will depend on your age, overall health, lifestyle risk factors, and family history of disease. Counseling  Your health care provider may ask you questions about your: Alcohol use. Tobacco use. Drug use. Emotional well-being. Home and relationship well-being. Sexual activity. Eating habits. History of falls. Memory and ability to understand (cognition). Work and work Statistician. Screening  You may have the following tests or measurements: Height, weight, and BMI. Blood pressure. Lipid and cholesterol levels. These may be checked every 5 years, or more frequently if you are over 44 years old. Skin check. Lung cancer screening. You may have this screening every year starting at age 71 if you have a 30-pack-year history of smoking and currently smoke or have quit within the past 15 years. Fecal occult blood test (FOBT) of the stool. You may have this test every year starting at age 71. Flexible sigmoidoscopy or colonoscopy. You may have a sigmoidoscopy every 5 years or a colonoscopy every 10 years starting at age 71. Prostate cancer screening. Recommendations will vary depending on your family history and other risks. Hepatitis C blood test. Hepatitis B blood test. Sexually transmitted disease (STD) testing. Diabetes screening. This is done by checking your blood sugar (glucose) after you have not eaten for a while (fasting). You may have this done every 1-3 years. Abdominal aortic aneurysm (AAA) screening. You may need this if you are a current or former smoker. Osteoporosis. You may be screened starting at age 71 if you are at high risk. Talk with your health care provider about your test results, treatment  options, and if necessary, the need for more  tests. Vaccines  Your health care provider may recommend certain vaccines, such as: Influenza vaccine. This is recommended every year. Tetanus, diphtheria, and acellular pertussis (Tdap, Td) vaccine. You may need a Td booster every 10 years. Zoster vaccine. You may need this after age 25. Pneumococcal 13-valent conjugate (PCV13) vaccine. One dose is recommended after age 71. Pneumococcal polysaccharide (PPSV23) vaccine. One dose is recommended after age 71. Talk to your health care provider about which screenings and vaccines you need and how often you need them. This information is not intended to replace advice given to you by your health care provider. Make sure you discuss any questions you have with your health care provider. Document Released: 12/14/2015 Document Revised: 08/06/2016 Document Reviewed: 09/18/2015 Elsevier Interactive Patient Education  2017 [place] Prevention in the Home Falls can cause injuries. They can happen to people of all ages. There are many things you can do to make your home safe and to help prevent falls. What can I do on the outside of my home? Regularly fix the edges of walkways and driveways and fix any cracks. Remove anything that might make you trip as you walk through a door, such as a raised step or threshold. Trim any bushes or trees on the path to your home. Use bright outdoor lighting. Clear any walking paths of anything that might make someone trip, such as rocks or tools. Regularly check to see if handrails are loose or broken. Make sure that both sides of any steps have handrails. Any raised decks and porches should have guardrails on the edges. Have any leaves, snow, or ice cleared regularly. Use sand or salt on walking paths during winter. Clean up any spills in your garage right away. This includes oil or grease spills. What can I do in the bathroom? Use night lights. Install grab bars by the toilet and in the tub and shower.  Do not use towel bars as grab bars. Use non-skid mats or decals in the tub or shower. If you need to sit down in the shower, use a plastic, non-slip stool. Keep the floor dry. Clean up any water that spills on the floor as soon as it happens. Remove soap buildup in the tub or shower regularly. Attach bath mats securely with double-sided non-slip rug tape. Do not have throw rugs and other things on the floor that can make you trip. What can I do in the bedroom? Use night lights. Make sure that you have a light by your bed that is easy to reach. Do not use any sheets or blankets that are too big for your bed. They should not hang down onto the floor. Have a firm chair that has side arms. You can use this for support while you get dressed. Do not have throw rugs and other things on the floor that can make you trip. What can I do in the kitchen? Clean up any spills right away. Avoid walking on wet floors. Keep items that you use a lot in easy-to-reach places. If you need to reach something above you, use a strong step stool that has a grab bar. Keep electrical cords out of the way. Do not use floor polish or wax that makes floors slippery. If you must use wax, use non-skid floor wax. Do not have throw rugs and other things on the floor that can make you trip. What can I do with my stairs? Do not  leave any items on the stairs. Make sure that there are handrails on both sides of the stairs and use them. Fix handrails that are broken or loose. Make sure that handrails are as long as the stairways. Check any carpeting to make sure that it is firmly attached to the stairs. Fix any carpet that is loose or worn. Avoid having throw rugs at the top or bottom of the stairs. If you do have throw rugs, attach them to the floor with carpet tape. Make sure that you have a light switch at the top of the stairs and the bottom of the stairs. If you do not have them, ask someone to add them for you. What else  can I do to help prevent falls? Wear shoes that: Do not have high heels. Have rubber bottoms. Are comfortable and fit you well. Are closed at the toe. Do not wear sandals. If you use a stepladder: Make sure that it is fully opened. Do not climb a closed stepladder. Make sure that both sides of the stepladder are locked into place. Ask someone to hold it for you, if possible. Clearly mark and make sure that you can see: Any grab bars or handrails. First and last steps. Where the edge of each step is. Use tools that help you move around (mobility aids) if they are needed. These include: Canes. Walkers. Scooters. Crutches. Turn on the lights when you go into a dark area. Replace any light bulbs as soon as they burn out. Set up your furniture so you have a clear path. Avoid moving your furniture around. If any of your floors are uneven, fix them. If there are any pets around you, be aware of where they are. Review your medicines with your doctor. Some medicines can make you feel dizzy. This can increase your chance of falling. Ask your doctor what other things that you can do to help prevent falls. This information is not intended to replace advice given to you by your health care provider. Make sure you discuss any questions you have with your health care provider. Document Released: 09/13/2009 Document Revised: 04/24/2016 Document Reviewed: 12/22/2014 Elsevier Interactive Patient Education  2017 [employer].

## 2022-01-28 ENCOUNTER — Ambulatory Visit (AMBULATORY_SURGERY_CENTER): Payer: Medicare HMO | Admitting: Internal Medicine

## 2022-01-28 ENCOUNTER — Encounter: Payer: Self-pay | Admitting: Internal Medicine

## 2022-01-28 VITALS — BP 130/78 | HR 72 | Temp 98.4°F | Resp 16 | Ht 71.0 in | Wt 206.0 lb

## 2022-01-28 DIAGNOSIS — K219 Gastro-esophageal reflux disease without esophagitis: Secondary | ICD-10-CM

## 2022-01-28 DIAGNOSIS — K2100 Gastro-esophageal reflux disease with esophagitis, without bleeding: Secondary | ICD-10-CM

## 2022-01-28 DIAGNOSIS — Z1211 Encounter for screening for malignant neoplasm of colon: Secondary | ICD-10-CM | POA: Diagnosis not present

## 2022-01-28 DIAGNOSIS — D122 Benign neoplasm of ascending colon: Secondary | ICD-10-CM

## 2022-01-28 DIAGNOSIS — R1319 Other dysphagia: Secondary | ICD-10-CM | POA: Diagnosis not present

## 2022-01-28 DIAGNOSIS — K222 Esophageal obstruction: Secondary | ICD-10-CM | POA: Diagnosis not present

## 2022-01-28 MED ORDER — PANTOPRAZOLE SODIUM 40 MG PO TBEC: 40.0000 mg | DELAYED_RELEASE_TABLET | Freq: Every day | ORAL | 11 refills | Status: DC

## 2022-01-28 MED ORDER — SODIUM CHLORIDE 0.9 % IV SOLN: 500.0000 mL | Freq: Once | INTRAVENOUS | Status: DC

## 2022-01-28 NOTE — Progress Notes (Signed)
HISTORY OF PRESENT ILLNESS:   Jorge Rogers is a 71 y.o. male with past medical history as listed below who presents today regarding the need for follow-up screening colonoscopy and dysphagia.  The patient underwent colonoscopy in 2004 and again in October 2012.  He was found to have left-sided diverticulosis but no colorectal neoplasia.  Follow-up in 10 years recommended.  Patient also has a history of chronic GERD for which she takes omeprazole daily.  Off medication he will experience reflux symptoms.  He does not take his medicine daily.  He did undergo upper endoscopy January 2004.  He was found to have a distal esophageal stricture as well as reflux esophagitis.  No dilation at that time.  He reports to me history of intermittent solid food dysphagia.  Can occur when not chewing foods well.  His GI review of systems is otherwise negative.  Review of blood work from September 05, 2021 shows unremarkable comprehensive metabolic panel.  Normal liver test.  Normal CBC with hemoglobin 14.7.   REVIEW OF SYSTEMS:   All non-GI ROS negative unless otherwise stated in the HPI except for urinary leakage       Past Medical History:  Diagnosis Date   Pulmonary embolism ([facility]) 07/2007    B PE and RLE DVT           Past Surgical History:  Procedure Laterality Date   ACHILLES TENDON REPAIR          Social History Louie Flenner Dittmer  reports that he has never smoked. He has never used smokeless tobacco. He reports current alcohol use. He reports that he does not use drugs.   family history includes Heart disease (age of onset: 35) in his father.   No Known Allergies       PHYSICAL EXAMINATION: Vital signs: BP 114/76    Pulse 70    Ht 5\' 11"  (1.803 m)    Wt 206 lb 2 oz (93.5 kg)    BMI 28.75 kg/m   Constitutional: generally well-appearing, no acute distress Psychiatric: alert and oriented x3, cooperative Eyes: extraocular movements intact, anicteric, conjunctiva pink Mouth: Mask Neck: supple no  lymphadenopathy Cardiovascular: heart regular rate and rhythm, no murmur Lungs: clear to auscultation bilaterally Abdomen: soft, nontender, nondistended, no obvious ascites, no peritoneal signs, normal bowel sounds, no organomegaly Rectal: Deferred till colonoscopy Extremities: no clubbing, cyanosis, or lower extremity edema bilaterally Skin: no lesions on visible extremities Neuro: No focal deficits.  Cranial nerves intact   ASSESSMENT:   1.  Chronic GERD.  Requires PPI to control symptoms. 2.  Esophageal stricture on previous EGD. 3.  Intermittent solid food dysphagia likely secondary to known peptic stricture. 4.  Previous colonoscopy 2004, 2012.  Diverticulosis.  No neoplasia.  Due for follow-up screening.  Motivated.  No active complaints     PLAN:   1.  Reflux precautions 2.  Continue omeprazole 20 mg daily 3.  Schedule upper endoscopy with esophageal dilation.The nature of the procedure, as well as the risks, benefits, and alternatives were carefully and thoroughly reviewed with the patient. Ample time for discussion and questions allowed. The patient understood, was satisfied, and agreed to proceed.  4.  Schedule screening colonoscopy.The nature of the procedure, as well as the risks, benefits, and alternatives were carefully and thoroughly reviewed with the patient. Ample time for discussion and questions allowed. The patient understood, was satisfied, and agreed to proceed.

## 2022-01-28 NOTE — Op Note (Signed)
[facility] Patient Name: Jorge Rogers Procedure Date: 01/28/2022 2:29 PM MRN: [id number] Endoscopist: Docia Chuck. Henrene Pastor , MD Age: 71 Referring MD:  Date of Birth: 1951-11-01 Gender: Male Account #: [id number] Procedure:                Upper GI endoscopy with [facility] dilation of the                            esophagus. 74 [address] Indications:              Dysphagia, GERD Medicines:                Monitored Anesthesia Care Procedure:                Pre-Anesthesia Assessment:                           - Prior to the procedure, a History and Physical                            was performed, and patient medications and                            allergies were reviewed. The patient's tolerance of                            previous anesthesia was also reviewed. The risks                            and benefits of the procedure and the sedation                            options and risks were discussed with the patient.                            All questions were answered, and informed consent                            was obtained. Prior Anticoagulants: The patient has                            taken no previous anticoagulant or antiplatelet                            agents. ASA Grade Assessment: II - A patient with                            mild systemic disease. After reviewing the risks                            and benefits, the patient was deemed in                            satisfactory condition to undergo the procedure.  After obtaining informed consent, the endoscope was                            passed under direct vision. Throughout the                            procedure, the patient's blood pressure, pulse, and                            oxygen saturations were monitored continuously. The                            GIF [id number] #[id number] was introduced through the                            mouth, and advanced to the second part of  duodenum.                            The upper GI endoscopy was accomplished without                            difficulty. The patient tolerated the procedure                            well. Scope In: Scope Out: Findings:                 The esophagus was slightly tortuous. There was                            inflammation at the gastroesophageal junction with                            edema. No Barrett's esophagus.                           One benign-appearing, intrinsic moderate stenosis                            was found 35 cm from the incisors. This stenosis                            measured 1.4 cm (inner diameter). After completing                            the endoscopic survey, the scope was withdrawn.                            Dilation was performed with a Maloney dilator with                            no resistance at 46 Fr.                           The stomach was normal save for moderate  hiatal                            hernia.                           The examined duodenum was normal.                           The cardia and gastric fundus were normal on                            retroflexion. Complications:            No immediate complications. Estimated Blood Loss:     Estimated blood loss: none. Impression:               1. GERD with esophagitis and peptic stricture                            status post dilation                           2. Moderate hiatal hernia                           3. Otherwise unremarkable EGD. Recommendation:           1. Patient has a contact number available for                            emergencies. The signs and symptoms of potential                            delayed complications were discussed with the                            patient. Return to normal activities tomorrow.                            Written discharge instructions were provided to the                            patient.                           2.  Post dilation diet.                           3. Continue present medications.                           4. Prescribe pantoprazole 40 mg daily; #30; 11                            refills. Please take this medicine DAILY to control                            reflux symptoms  and protect your esophagus                           5. Routine office follow-up with Dr. Henrene Pastor in 3                            months Docia Chuck. Henrene Pastor, MD 01/28/2022 3:07:59 PM This report has been signed electronically.

## 2022-01-28 NOTE — Progress Notes (Signed)
Called to room to assist during endoscopic procedure.  Patient ID and intended procedure confirmed with present staff. Received instructions for my participation in the procedure from the performing physician.  

## 2022-01-28 NOTE — Patient Instructions (Signed)
Follow post dilation diet today and resume previous medications. Awaiting pathology results. Repeat Colonoscopy in 7-10 years for surveillance. Start Pantoprazole 40 MG daily. Routine office follow up with Dr. Henrene Pastor in 3 months.  YOU HAD AN ENDOSCOPIC PROCEDURE TODAY AT [facility]:   Refer to the procedure report that was given to you for any specific questions about what was found during the examination.  If the procedure report does not answer your questions, please call your gastroenterologist to clarify.  If you requested that your care partner not be given the details of your procedure findings, then the procedure report has been included in a sealed envelope for you to review at your convenience later.  YOU SHOULD EXPECT: Some feelings of bloating in the abdomen. Passage of more gas than usual.  Walking can help get rid of the air that was put into your GI tract during the procedure and reduce the bloating. If you had a lower endoscopy (such as a colonoscopy or flexible sigmoidoscopy) you may notice spotting of blood in your stool or on the toilet paper. If you underwent a bowel prep for your procedure, you may not have a normal bowel movement for a few days.  Please Note:  You might notice some irritation and congestion in your nose or some drainage.  This is from the oxygen used during your procedure.  There is no need for concern and it should clear up in a day or so.  SYMPTOMS TO REPORT IMMEDIATELY:  Following lower endoscopy (colonoscopy or flexible sigmoidoscopy):  Excessive amounts of blood in the stool  Significant tenderness or worsening of abdominal pains  Swelling of the abdomen that is new, acute  Fever of 100F or higher  Following upper endoscopy (EGD)  Vomiting of blood or coffee ground material  New chest pain or pain under the shoulder blades  Painful or persistently difficult swallowing  New shortness of breath  Fever of 100F or higher  Black,  tarry-looking stools  For urgent or emergent issues, a gastroenterologist can be reached at any hour by calling [phone]. Do not use MyChart messaging for urgent concerns.    DIET:  We do recommend a small meal at first, but then you may proceed to your regular diet.  Drink plenty of fluids but you should avoid alcoholic beverages for 24 hours.  ACTIVITY:  You should plan to take it easy for the rest of today and you should NOT DRIVE or use heavy machinery until tomorrow (because of the sedation medicines used during the test).    FOLLOW UP: Our staff will call the number listed on your records 48-72 hours following your procedure to check on you and address any questions or concerns that you may have regarding the information given to you following your procedure. If we do not reach you, we will leave a message.  We will attempt to reach you two times.  During this call, we will ask if you have developed any symptoms of COVID 19. If you develop any symptoms (ie: fever, flu-like symptoms, shortness of breath, cough etc.) before then, please call [phone].  If you test positive for Covid 19 in the 2 weeks post procedure, please call and report this information to [place].    If any biopsies were taken you will be contacted by phone or by letter within the next 1-3 weeks.  Please call us at [phone] if you have not heard about the biopsies in 3 weeks.  SIGNATURES/CONFIDENTIALITY: You and/or your care partner have signed paperwork which will be entered into your electronic medical record.  These signatures attest to the fact that that the information above on your After Visit Summary has been reviewed and is understood.  Full responsibility of the confidentiality of this discharge information lies with you and/or your care-partner.

## 2022-01-28 NOTE — Op Note (Signed)
[facility] Patient Name: Jorge Rogers Procedure Date: 01/28/2022 2:29 PM MRN: [id number] Endoscopist: Docia Chuck. Henrene Pastor , MD Age: 71 Referring MD:  Date of Birth: January 28, 1951 Gender: Male Account #: [id number] Procedure:                Colonoscopy with cold snare polypectomy x 2 Indications:              Screening for colorectal malignant neoplasm.                            Previous examinations 2004, 2012 were negative for                            neoplasia Medicines:                Monitored Anesthesia Care Procedure:                Pre-Anesthesia Assessment:                           - Prior to the procedure, a History and Physical                            was performed, and patient medications and                            allergies were reviewed. The patient's tolerance of                            previous anesthesia was also reviewed. The risks                            and benefits of the procedure and the sedation                            options and risks were discussed with the patient.                            All questions were answered, and informed consent                            was obtained. Prior Anticoagulants: The patient has                            taken no previous anticoagulant or antiplatelet                            agents. ASA Grade Assessment: II - A patient with                            mild systemic disease. After reviewing the risks                            and benefits, the patient was deemed in  satisfactory condition to undergo the procedure.                           After obtaining informed consent, the colonoscope                            was passed under direct vision. Throughout the                            procedure, the patient's blood pressure, pulse, and                            oxygen saturations were monitored continuously. The                            CF [id number] #[id number] was  introduced through the anus                            and advanced to the the cecum, identified by                            appendiceal orifice and ileocecal valve. The                            ileocecal valve, appendiceal orifice, and rectum                            were photographed. The quality of the bowel                            preparation was excellent. The colonoscopy was                            performed without difficulty. The patient tolerated                            the procedure well. The bowel preparation used was                            SUPREP via split dose instruction. Scope In: 2:37:58 PM Scope Out: 2:51:40 PM Scope Withdrawal Time: 0 hours 10 minutes 53 seconds  Total Procedure Duration: 0 hours 13 minutes 42 seconds  Findings:                 Two polyps were found in the ascending colon. The                            polyps were 4 to 5 mm in size. These polyps were                            removed with a cold snare. Resection and retrieval                            were complete.  Many small and large-mouthed diverticula were found                            in the left colon.                           Internal hemorrhoids were found during retroflexion.                           The exam was otherwise without abnormality on                            direct and retroflexion views. Complications:            No immediate complications. Estimated blood loss:                            None. Estimated Blood Loss:     Estimated blood loss: none. Impression:               - Two 4 to 5 mm polyps in the ascending colon,                            removed with a cold snare. Resected and retrieved.                           - Diverticulosis in the left colon.                           - Internal hemorrhoids.                           - The examination was otherwise normal on direct                            and retroflexion  views. Recommendation:           - Repeat colonoscopy in 7-10 years for surveillance.                           - Patient has a contact number available for                            emergencies. The signs and symptoms of potential                            delayed complications were discussed with the                            patient. Return to normal activities tomorrow.                            Written discharge instructions were provided to the                            patient.                           -  Resume previous diet.                           - Continue present medications.                           - Await pathology results. Docia Chuck. Henrene Pastor, MD 01/28/2022 2:57:23 PM This report has been signed electronically.

## 2022-01-28 NOTE — Progress Notes (Signed)
VS- [facility] watch given to wife  Cell phone off per pt

## 2022-01-28 NOTE — Progress Notes (Signed)
Sedate, gd SR, tolerated procedure well, VSS, report to RN 

## 2022-01-30 ENCOUNTER — Telehealth: Payer: Self-pay | Admitting: *Deleted

## 2022-01-30 NOTE — Telephone Encounter (Signed)
?  Follow up Call- ? ?Call back number 01/28/2022  ?Post procedure Call Back phone  # [phone]  ?Permission to leave phone message Yes  ?Some recent data might be hidden  ?  ? ?Patient questions: ? ?Do you have a fever, pain , or abdominal swelling? No. ?Pain Score  0 * ? ?Have you tolerated food without any problems? Yes.   ? ?Have you been able to return to your normal activities? Yes.   ? ?Do you have any questions about your discharge instructions: ?Diet   No. ?Medications  No. ?Follow up visit  No. ? ?Do you have questions or concerns about your Care? No. ? ?Actions: ?* If pain score is 4 or above: ?No action needed, pain <4. ? ?Have you developed a fever since your procedure? no ? ?2.   Have you had an respiratory symptoms (SOB or cough) since your procedure? no ? ?3.   Have you tested positive for COVID 19 since your procedure no ? ?4.   Have you had any family members/close contacts diagnosed with the COVID 19 since your procedure?  no ? ? ?If yes to any of these questions please route to Joylene John, RN and Joella Prince, RN  ? ? ?

## 2022-01-30 NOTE — Telephone Encounter (Signed)
NO answer on first attempt follow up call. Left message  ?

## 2022-02-01 ENCOUNTER — Encounter: Payer: Self-pay | Admitting: Internal Medicine

## 2022-03-21 DIAGNOSIS — Z961 Presence of intraocular lens: Secondary | ICD-10-CM | POA: Diagnosis not present

## 2022-03-21 DIAGNOSIS — H524 Presbyopia: Secondary | ICD-10-CM | POA: Diagnosis not present

## 2022-03-21 DIAGNOSIS — H52222 Regular astigmatism, left eye: Secondary | ICD-10-CM | POA: Diagnosis not present

## 2022-05-02 ENCOUNTER — Encounter: Payer: Self-pay | Admitting: Internal Medicine

## 2022-05-02 ENCOUNTER — Ambulatory Visit: Payer: Medicare HMO | Admitting: Internal Medicine

## 2022-05-02 VITALS — BP 124/58 | HR 65 | Ht 71.0 in | Wt 196.5 lb

## 2022-05-02 DIAGNOSIS — K219 Gastro-esophageal reflux disease without esophagitis: Secondary | ICD-10-CM

## 2022-05-02 DIAGNOSIS — K2100 Gastro-esophageal reflux disease with esophagitis, without bleeding: Secondary | ICD-10-CM

## 2022-05-02 DIAGNOSIS — Z8719 Personal history of other diseases of the digestive system: Secondary | ICD-10-CM

## 2022-05-02 DIAGNOSIS — R1310 Dysphagia, unspecified: Secondary | ICD-10-CM

## 2022-05-02 MED ORDER — PANTOPRAZOLE SODIUM 40 MG PO TBEC: 40.0000 mg | DELAYED_RELEASE_TABLET | Freq: Every day | ORAL | 3 refills | Status: DC

## 2022-05-02 NOTE — Patient Instructions (Signed)
If you are age 71 or older, your body mass index should be between 23-30. Your Body mass index is 27.41 kg/m. If this is out of the aforementioned range listed, please consider follow up with your Primary Care Provider.  If you are age 75 or younger, your body mass index should be between 19-25. Your Body mass index is 27.41 kg/m. If this is out of the aformentioned range listed, please consider follow up with your Primary Care Provider.   ________________________________________________________  The [facility] providers would like to encourage you to use [facility] to communicate with providers for non-urgent requests or questions.  Due to long hold times on the telephone, sending your provider a message by [facility] may be a faster and more efficient way to get a response.  Please allow 48 business hours for a response.  Please remember that this is for non-urgent requests.  _______________________________________________________  We have sent the following medications to your pharmacy for you to pick up at your convenience:  Protonix  Please follow up in one year

## 2022-05-02 NOTE — Progress Notes (Signed)
HISTORY OF PRESENT ILLNESS:  Jorge Rogers is a 71 y.o. male with chronic GERD who was evaluated October 17, 2021 for intermittent solid food dysphagia.  Also the need for screening colonoscopy.  January 28, 2022 he underwent both colonoscopy and endoscopy.  Colonoscopy revealed diminutive adenomatous colon polyps, left-sided diverticulosis, and internal hemorrhoids.  Follow-up in 7 years recommended.  Upper endoscopy revealed a distal esophageal stricture with esophagitis and a moderate hiatal hernia.  Esophagus was dilated with a 54 [place] Mal[place] dilator.  He was placed on pantoprazole 40 mg daily and asked to follow-up at this time.  Patient tells me that he has been compliant with pantoprazole therapy.  He is also been taking omeprazole 20 mg daily (did not realize he was to stop this drug).  He is having no reflux symptoms.  No recurrent dysphagia.  No appreciable medication side effects.  No new complaints.  REVIEW OF SYSTEMS:  All non-GI ROS negative.  Past Medical History:  Diagnosis Date   Arthritis    knees   Hyperlipidemia    Pulmonary embolism ([place]) 07/02/2007   B PE and RLE DVT    Past Surgical History:  Procedure Laterality Date   ACHILLES TENDON REPAIR     COLONOSCOPY     UPPER GASTROINTESTINAL ENDOSCOPY      Social History [name]  reports that he has never smoked. He has never used smokeless tobacco. He reports current alcohol use. He reports that he does not use drugs.  family history includes Heart disease (age of onset: 22) in his father.  No Known Allergies     PHYSICAL EXAMINATION: Vital signs: BP (!) 124/58   Pulse 65   Ht '5\' 11"'$  (1.803 m)   Wt 196 lb 8 oz (89.1 kg)   BMI 27.41 kg/m   Constitutional: generally well-appearing, no acute distress Psychiatric: alert and oriented x3, cooperative Eyes: Anicteric Abdomen: Not reexamined Skin: no lesions on visible extremities Neuro: Grossly intact  ASSESSMENT:  1.  GERD complicated by  esophagitis and symptomatic peptic stricture.  Currently asymptomatic post dilation on PPI 2.  History of adenomatous colon polyps.  Last colonoscopy February 2023   PLAN:  1.  Reflux precautions 2.  Refill pantoprazole 40 mg daily.  Medication risk reviewed 3.  Routine office follow-up 1 year.  Contact the office in the interim for any questions or problems 4.  Routine surveillance colonoscopy around February 2030

## 2022-05-11 ENCOUNTER — Telehealth: Payer: Medicare HMO | Admitting: Family

## 2022-05-11 DIAGNOSIS — J069 Acute upper respiratory infection, unspecified: Secondary | ICD-10-CM | POA: Diagnosis not present

## 2022-05-11 MED ORDER — BENZONATATE 100 MG PO CAPS: 100.0000 mg | ORAL_CAPSULE | Freq: Three times a day (TID) | ORAL | 0 refills | Status: DC | PRN

## 2022-05-11 MED ORDER — FLUTICASONE PROPIONATE 50 MCG/ACT NA SUSP: 2.0000 | Freq: Every day | NASAL | 6 refills | Status: DC

## 2022-05-11 NOTE — Progress Notes (Signed)

## 2022-06-16 ENCOUNTER — Other Ambulatory Visit: Payer: Self-pay | Admitting: Internal Medicine

## 2022-09-11 ENCOUNTER — Encounter: Payer: Self-pay | Admitting: Internal Medicine

## 2022-09-11 ENCOUNTER — Ambulatory Visit (INDEPENDENT_AMBULATORY_CARE_PROVIDER_SITE_OTHER): Payer: Medicare HMO | Admitting: Internal Medicine

## 2022-09-11 VITALS — BP 130/78 | HR 67 | Temp 98.7°F | Ht 71.0 in | Wt 200.2 lb

## 2022-09-11 DIAGNOSIS — I2583 Coronary atherosclerosis due to lipid rich plaque: Secondary | ICD-10-CM

## 2022-09-11 DIAGNOSIS — N183 Chronic kidney disease, stage 3 unspecified: Secondary | ICD-10-CM | POA: Diagnosis not present

## 2022-09-11 DIAGNOSIS — Z125 Encounter for screening for malignant neoplasm of prostate: Secondary | ICD-10-CM

## 2022-09-11 DIAGNOSIS — I2510 Atherosclerotic heart disease of native coronary artery without angina pectoris: Secondary | ICD-10-CM | POA: Diagnosis not present

## 2022-09-11 DIAGNOSIS — Z0000 Encounter for general adult medical examination without abnormal findings: Secondary | ICD-10-CM

## 2022-09-11 LAB — CBC WITH DIFFERENTIAL/PLATELET
Basophils Absolute: 0 10*3/uL (ref 0.0–0.1)
Basophils Relative: 0.6 % (ref 0.0–3.0)
Eosinophils Absolute: 0.2 10*3/uL (ref 0.0–0.7)
Eosinophils Relative: 3.3 % (ref 0.0–5.0)
HCT: 45.4 % (ref 39.0–52.0)
Hemoglobin: 15.4 g/dL (ref 13.0–17.0)
Lymphocytes Relative: 37.2 % (ref 12.0–46.0)
Lymphs Abs: 2.1 10*3/uL (ref 0.7–4.0)
MCHC: 33.8 g/dL (ref 30.0–36.0)
MCV: 94.6 fl (ref 78.0–100.0)
Monocytes Absolute: 0.6 10*3/uL (ref 0.1–1.0)
Monocytes Relative: 10.3 % (ref 3.0–12.0)
Neutro Abs: 2.8 10*3/uL (ref 1.4–7.7)
Neutrophils Relative %: 48.6 % (ref 43.0–77.0)
Platelets: 189 10*3/uL (ref 150.0–400.0)
RBC: 4.8 Mil/uL (ref 4.22–5.81)
RDW: 13.3 % (ref 11.5–15.5)
WBC: 5.8 10*3/uL (ref 4.0–10.5)

## 2022-09-11 LAB — URINALYSIS, ROUTINE W REFLEX MICROSCOPIC
Bilirubin Urine: NEGATIVE
Ketones, ur: NEGATIVE
Leukocytes,Ua: NEGATIVE
Nitrite: NEGATIVE
Specific Gravity, Urine: 1.015 (ref 1.000–1.030)
Total Protein, Urine: NEGATIVE
Urine Glucose: NEGATIVE
Urobilinogen, UA: 0.2 (ref 0.0–1.0)
pH: 7.5 (ref 5.0–8.0)

## 2022-09-11 LAB — LIPID PANEL
Cholesterol: 142 mg/dL (ref 0–200)
HDL: 58.9 mg/dL (ref >39.00)
LDL Cholesterol: 69 mg/dL (ref 0–99)
NonHDL: 82.81
Total CHOL/HDL Ratio: 2
Triglycerides: 67 mg/dL (ref 0.0–149.0)
VLDL: 13.4 mg/dL (ref 0.0–40.0)

## 2022-09-11 LAB — COMPREHENSIVE METABOLIC PANEL
ALT: 15 U/L (ref 0–53)
AST: 21 U/L (ref 0–37)
Albumin: 4.3 g/dL (ref 3.5–5.2)
Alkaline Phosphatase: 67 U/L (ref 39–117)
BUN: 16 mg/dL (ref 6–23)
CO2: 30 mEq/L (ref 19–32)
Calcium: 10 mg/dL (ref 8.4–10.5)
Chloride: 104 mEq/L (ref 96–112)
Creatinine, Ser: 1.25 mg/dL (ref 0.40–1.50)
GFR: 58.08 mL/min — ABNORMAL LOW (ref >60.00)
Glucose, Bld: 78 mg/dL (ref 70–99)
Potassium: 4.4 mEq/L (ref 3.5–5.1)
Sodium: 140 mEq/L (ref 135–145)
Total Bilirubin: 0.7 mg/dL (ref 0.2–1.2)
Total Protein: 7.1 g/dL (ref 6.0–8.3)

## 2022-09-11 LAB — PSA: PSA: 1.32 ng/mL (ref 0.10–4.00)

## 2022-09-11 LAB — TSH: TSH: 1.37 u[IU]/mL (ref 0.35–5.50)

## 2022-09-11 NOTE — Assessment & Plan Note (Signed)
Start Crestor low dose

## 2022-09-11 NOTE — Assessment & Plan Note (Signed)
Good hydration Monitor GFR

## 2022-09-11 NOTE — Assessment & Plan Note (Signed)
  We discussed age appropriate health related issues, including available/recomended screening tests and vaccinations. Labs were ordered to be later reviewed . All questions were answered. We discussed one or more of the following - seat belt use, use of sunscreen/sun exposure exercise, safe sex, fall risk reduction, second hand smoke exposure, firearm use and storage, seat belt use, a need for adhering to healthy diet and exercise. Labs were ordered.  All questions were answered.  Cor calc CT score is 4 --- 10/21 Last colon 2023 - due in 7-10 years

## 2022-09-11 NOTE — Progress Notes (Signed)
Subjective:  Patient ID: BALDO HUFNAGLE, male    DOB: 08-01-1951  Age: 71 y.o. MRN: [id number]  CC: Annual Exam   HPI Sie Formisano Mcvicar presents for well exam  Outpatient Medications Prior to Visit  Medication Sig Dispense Refill   aspirin 81 MG tablet Take 81 mg by mouth daily.     Cholecalciferol (VITAMIN D3) 50 MCG (2000 UT) capsule Take 1 capsule (2,000 Units total) by mouth daily. 100 capsule 3   fluticasone (FLONASE) 50 MCG/ACT nasal spray Place 2 sprays into both nostrils daily. 16 g 6   pantoprazole (PROTONIX) 40 MG tablet Take 1 tablet (40 mg total) by mouth daily. 90 tablet 3   rosuvastatin (CRESTOR) 5 MG tablet Take 1 tablet (5 mg total) by mouth daily. Annual appt due in Oct must see provider for future refills 90 tablet 0   triamcinolone ointment (KENALOG) 0.1 % Apply 1 application topically 2 (two) times daily. 80 g 2   omeprazole (PRILOSEC) 20 MG capsule Take 20 mg by mouth daily.     benzonatate (TESSALON PERLES) 100 MG capsule Take 1 capsule (100 mg total) by mouth 3 (three) times daily as needed. (Patient not taking: Reported on 09/11/2022) 20 capsule 0   0.9 %  sodium chloride infusion      No facility-administered medications prior to visit.    ROS: Review of Systems  Constitutional:  Negative for appetite change, fatigue and unexpected weight change.  HENT:  Negative for congestion, nosebleeds, sneezing, sore throat and trouble swallowing.   Eyes:  Negative for itching and visual disturbance.  Respiratory:  Negative for cough.   Cardiovascular:  Negative for chest pain, palpitations and leg swelling.  Gastrointestinal:  Negative for abdominal distention, blood in stool, diarrhea and nausea.  Genitourinary:  Negative for frequency and hematuria.  Musculoskeletal:  Negative for back pain, gait problem, joint swelling and neck pain.  Skin:  Negative for rash.  Neurological:  Negative for dizziness, tremors, speech difficulty and weakness.  Psychiatric/Behavioral:   Negative for agitation, dysphoric mood and sleep disturbance. The patient is not nervous/anxious.     Objective:  BP 130/78 (BP Location: Left Arm)   Pulse 67   Temp 98.7 F (37.1 C) (Oral)   Ht '5\' 11"'$  (1.803 m)   Wt 200 lb 3.2 oz (90.8 kg)   SpO2 98%   BMI 27.92 kg/m   BP Readings from Last 3 Encounters:  09/11/22 130/78  05/02/22 (!) 124/58  01/28/22 130/78    Wt Readings from Last 3 Encounters:  09/11/22 200 lb 3.2 oz (90.8 kg)  05/02/22 196 lb 8 oz (89.1 kg)  01/28/22 206 lb (93.4 kg)    Physical Exam Constitutional:      General: He is not in acute distress.    Appearance: He is well-developed.     Comments: NAD  Eyes:     Conjunctiva/sclera: Conjunctivae normal.     Pupils: Pupils are equal, round, and reactive to light.  Neck:     Thyroid: No thyromegaly.     Vascular: No JVD.  Cardiovascular:     Rate and Rhythm: Normal rate and regular rhythm.     Heart sounds: Normal heart sounds. No murmur heard.    No friction rub. No gallop.  Pulmonary:     Effort: Pulmonary effort is normal. No respiratory distress.     Breath sounds: Normal breath sounds. No wheezing or rales.  Chest:     Chest wall: No tenderness.  Abdominal:  General: Bowel sounds are normal. There is no distension.     Palpations: Abdomen is soft. There is no mass.     Tenderness: There is no abdominal tenderness. There is no guarding or rebound.  Musculoskeletal:        General: No tenderness. Normal range of motion.     Cervical back: Normal range of motion.  Lymphadenopathy:     Cervical: No cervical adenopathy.  Skin:    General: Skin is warm and dry.     Findings: No rash.  Neurological:     Mental Status: He is alert and oriented to person, place, and time.     Cranial Nerves: No cranial nerve deficit.     Motor: No abnormal muscle tone.     Coordination: Coordination normal.     Gait: Gait normal.     Deep Tendon Reflexes: Reflexes are normal and symmetric.  Psychiatric:         Behavior: Behavior normal.        Thought Content: Thought content normal.        Judgment: Judgment normal.   Rectal per GI  Lab Results  Component Value Date   WBC 5.2 09/05/2021   HGB 14.7 09/05/2021   HCT 43.5 09/05/2021   PLT 205.0 09/05/2021   GLUCOSE 86 09/05/2021   GLUCOSE 86 09/05/2021   CHOL 130 09/05/2021   TRIG 68.0 09/05/2021   HDL 55.40 09/05/2021   LDLCALC 61 09/05/2021   ALT 15 09/05/2021   AST 21 09/05/2021   NA 138 09/05/2021   NA 138 09/05/2021   K 4.3 09/05/2021   K 4.3 09/05/2021   CL 104 09/05/2021   CL 104 09/05/2021   CREATININE 1.34 09/05/2021   CREATININE 1.34 09/05/2021   BUN 14 09/05/2021   BUN 14 09/05/2021   CO2 29 09/05/2021   CO2 29 09/05/2021   TSH 1.67 09/05/2021   PSA 1.44 09/05/2021   INR 2.2 RATIO (H) 07/15/2007   MICROALBUR 35.7 Repeated and verified X2. (H) 06/20/2013    US RENAL  Result Date: 09/25/2021 CLINICAL DATA:  Renal insufficiency. EXAM: RENAL / URINARY TRACT ULTRASOUND COMPLETE COMPARISON:  None. FINDINGS: Right Kidney: Renal measurements: 9.0 cm x 5.4 cm x 5.4 cm = volume: 135 mL. Echogenicity within normal limits. No mass or hydronephrosis visualized. Left Kidney: Renal measurements: 10.0 cm x 5.6 cm x 5.0 cm = volume: 145 mL. Echogenicity within normal limits. No mass or hydronephrosis visualized. Bladder: Appears normal for degree of bladder distention. Other: None. IMPRESSION: Normal renal ultrasound. Electronically Signed   By: Virgina Norfolk M.D.   On: 09/25/2021 20:49    Assessment & Plan:   Problem List Items Addressed This Visit     Coronary atherosclerosis due to lipid rich plaque    Start Crestor low dose      Relevant Orders   TSH   Urinalysis   CBC with Differential/Platelet   Lipid panel   PSA   Comprehensive metabolic panel   CRI (chronic renal insufficiency), stage 3 (moderate) (HCC)    Good hydration Monitor GFR      Relevant Orders   TSH   Urinalysis   CBC with  Differential/Platelet   Lipid panel   PSA   Comprehensive metabolic panel   Well adult exam - Primary     We discussed age appropriate health related issues, including available/recomended screening tests and vaccinations. Labs were ordered to be later reviewed . All questions were answered. We discussed one  or more of the following - seat belt use, use of sunscreen/sun exposure exercise, safe sex, fall risk reduction, second hand smoke exposure, firearm use and storage, seat belt use, a need for adhering to healthy diet and exercise. Labs were ordered.  All questions were answered.  Cor calc CT score is 4 --- 10/21 Last colon 2023 - due in 7-10 years      Relevant Orders   TSH   Urinalysis   CBC with Differential/Platelet   Lipid panel   PSA   Comprehensive metabolic panel      No orders of the defined types were placed in this encounter.     Follow-up: Return in about 1 year (around 09/12/2023) for Wellness Exam.  Walker Kehr, MD

## 2022-11-12 ENCOUNTER — Other Ambulatory Visit: Payer: Self-pay | Admitting: Internal Medicine

## 2022-12-19 ENCOUNTER — Encounter: Payer: Self-pay | Admitting: Internal Medicine

## 2022-12-19 DIAGNOSIS — R300 Dysuria: Secondary | ICD-10-CM | POA: Diagnosis not present

## 2022-12-21 ENCOUNTER — Other Ambulatory Visit: Payer: Self-pay | Admitting: Internal Medicine

## 2022-12-21 MED ORDER — CEFUROXIME AXETIL 500 MG PO TABS: 500.0000 mg | ORAL_TABLET | Freq: Two times a day (BID) | ORAL | 0 refills | Status: DC

## 2023-01-02 ENCOUNTER — Ambulatory Visit (INDEPENDENT_AMBULATORY_CARE_PROVIDER_SITE_OTHER): Payer: Medicare HMO

## 2023-01-02 VITALS — BP 116/70 | HR 76 | Temp 98.6°F | Ht 71.0 in | Wt 206.8 lb

## 2023-01-02 DIAGNOSIS — Z0000 Encounter for general adult medical examination without abnormal findings: Secondary | ICD-10-CM

## 2023-01-02 NOTE — Progress Notes (Cosign Needed Addendum)
Subjective:   Jorge Rogers is a 72 y.o. male who presents for Medicare Annual/Subsequent preventive examination.  Review of Systems     Cardiac Risk Factors include: advanced age (>31mn, >>81women);dyslipidemia;family history of premature cardiovascular disease;male gender     Objective:    Today's Vitals   01/02/23 0850  BP: 116/70  Pulse: 76  Temp: 98.6 F (37 C)  TempSrc: Temporal  SpO2: 99%  Weight: 206 lb 12.8 oz (93.8 kg)  Height: 5' 11"$  (1.803 m)  PainSc: 0-No pain   Body mass index is 28.84 kg/m.     01/02/2023    9:00 AM 12/26/2021    9:32 AM 12/25/2020    9:04 AM 11/29/2014    9:37 PM  Advanced Directives  Does Patient Have a Medical Advance Directive? No No No No  Type of ASocial research officer, governmentLiving will    Does patient want to make changes to medical advance directive?  Yes (MAU/Ambulatory/Procedural Areas - Information given)    Would patient like information on creating a medical advance directive? No - Patient declined  No - Patient declined No - patient declined information    Current Medications (verified) Outpatient Encounter Medications as of 01/02/2023  Medication Sig   aspirin 81 MG tablet Take 81 mg by mouth daily.   Cholecalciferol (VITAMIN D3) 50 MCG (2000 UT) capsule Take 1 capsule (2,000 Units total) by mouth daily.   fluticasone (FLONASE) 50 MCG/ACT nasal spray Place 2 sprays into both nostrils daily.   pantoprazole (PROTONIX) 40 MG tablet Take 1 tablet (40 mg total) by mouth daily.   rosuvastatin (CRESTOR) 5 MG tablet TAKE 1 TABLET EVERY DAY. ANNUAL APPT DUE IN OCT. MUST SEE MD FOR FUTURE REFILLS.   triamcinolone ointment (KENALOG) 0.1 % Apply 1 application topically 2 (two) times daily.   No facility-administered encounter medications on file as of 01/02/2023.    Allergies (verified) Patient has no known allergies.   History: Past Medical History:  Diagnosis Date   Arthritis    knees   Hyperlipidemia     [place]   B PE and RLE DVT   Past Surgical History:  Procedure Laterality Date   ACHILLES TENDON REPAIR     COLONOSCOPY     UPPER GASTROINTESTINAL ENDOSCOPY     Family History  Problem Relation Age of Onset   Heart disease Father 787      card arrest   Colon cancer Neg Hx    Esophageal cancer Neg Hx    Rectal cancer Neg Hx    Stomach cancer Neg Hx    Social History   Socioeconomic History   Marital status: Married    Spouse name: Not on file   Number of children: 3   Years of education: Not on file   Highest education level: Not on file  Occupational History   Not on file  Tobacco Use   Smoking status: Never   Smokeless tobacco: Never  Vaping Use   Vaping Use: Never used  Substance and Sexual Activity   Alcohol use: Yes    Comment: occ   Drug use: No   Sexual activity: Yes  Other Topics Concern   Not on file  Social History Narrative   Not on file   Social Determinants of Health   Financial Resource Strain: Low Risk  (01/02/2023)   Overall Financial Resource Strain (CARDIA)    Difficulty of Paying Living Expenses: Not hard at  all  Food Insecurity: No Food Insecurity (01/02/2023)   Hunger Vital Sign    Worried About Running Out of Food in the Last Year: Never true    Ran Out of Food in the Last Year: Never true  Transportation Needs: No Transportation Needs (01/02/2023)   PRAPARE - Hydrologist (Medical): No    Lack of Transportation (Non-Medical): No  Physical Activity: Insufficiently Active (01/02/2023)   Exercise Vital Sign    Days of Exercise per Week: 2 days    Minutes of Exercise per Session: 70 min  Stress: No Stress Concern Present (01/02/2023)   [place]    Feeling of Stress : Not at all  Social Connections: Unknown (01/02/2023)   Social Connection and Isolation Panel [NHANES]    Frequency of Communication with Friends and Family:  More than three times a week    Frequency of Social Gatherings with Friends and Family: Patient refused    Attends Religious Services: Not on Advertising copywriter or Organizations: Yes    Attends Archivist Meetings: Patient refused    Marital Status: Married    Tobacco Counseling Counseling given: Not Answered   Clinical Intake:  Pre-visit preparation completed: Yes  Pain : No/denies pain Pain Score: 0-No pain Effect of Pain on Daily Activities: Pain can diminish job performance, lower motivation to exercise and prevent you from completing daily tasks.  Pain produces disability and affects the quality of life.     BMI - recorded: 28.84 Nutritional Status: BMI 25 -29 Overweight Nutritional Risks: None Diabetes: No  How often do you need to have someone help you when you read instructions, pamphlets, or other written materials from your doctor or pharmacy?: 1 - Never What is the last grade level you completed in school?: HSG  Diabetic? No  Interpreter Needed?: No  Information entered by :: Lisette Abu, LPN.   Activities of Daily Living    01/02/2023    8:52 AM 12/31/2022    7:16 PM  In your present state of health, do you have any difficulty performing the following activities:  Hearing? 0 0  Vision? 0 0  Difficulty concentrating or making decisions? 0 0  Walking or climbing stairs? 0 0  Dressing or bathing? 0 0  Doing errands, shopping? 0 0  Preparing Food and eating ? N N  Using the Toilet? N N  In the past six months, have you accidently leaked urine? Y Y  Do you have problems with loss of bowel control? N N  Managing your Medications? N N  Managing your Finances? N N  Housekeeping or managing your Housekeeping? N N    Patient Care Team: Plotnikov, Evie Lacks, MD as PCP - General (Internal Medicine) Madilyn Hook, [place] as Consulting Physician (Optometry) Irene Shipper, MD as Consulting Physician (Gastroenterology)  Indicate any  recent Medical Services you may have received from other than [facility] providers in the past year (date may be approximate).     Assessment:   This is a routine wellness examination for [name].  Hearing/Vision screen Hearing Screening - Comments:: Denies hearing difficulties   Vision Screening - Comments:: Wears rx glasses - up to date with routine eye exams with Madilyn Hook, OD.   Dietary issues and exercise activities discussed: Current Exercise Habits: Home exercise routine, Time (Minutes): 60, Frequency (Times/Week): 3, Weekly Exercise (Minutes/Week): 180, Intensity: Moderate, Exercise limited by: orthopedic  condition(s)   Goals Addressed             This Visit's Progress    My goal for 2024 is to get back to physical activity.        Depression Screen    01/02/2023    8:52 AM 09/11/2022    9:56 AM 12/26/2021    9:51 AM 12/25/2020    8:59 AM 12/25/2020    8:58 AM 09/04/2020   11:03 AM 01/29/2018    1:24 PM  PHQ 2/9 Scores  PHQ - 2 Score 0 0 0 0 0 0 0  PHQ- 9 Score  1         Fall Risk    01/02/2023    8:52 AM 12/31/2022    7:16 PM 09/11/2022    9:56 AM 12/26/2021    9:52 AM 12/25/2020    9:08 AM  Fall Risk   Falls in the past year? 0 0 0 0 0  Number falls in past yr: 0 0 0 0 0  Injury with Fall? 0 0 0 0 0  Risk for fall due to : No Fall Risks  No Fall Risks No Fall Risks No Fall Risks  Follow up Falls prevention discussed   Falls evaluation completed     FALL RISK PREVENTION PERTAINING TO THE HOME:  Any stairs in or around the home? Yes  If so, are there any without handrails? No  Home free of loose throw rugs in walkways, pet beds, electrical cords, etc? Yes  Adequate lighting in your home to reduce risk of falls? Yes   ASSISTIVE DEVICES UTILIZED TO PREVENT FALLS:  Life alert? No  Use of a cane, walker or w/c? No  Grab bars in the bathroom? Yes  Shower chair or bench in shower? No  Elevated toilet seat or a handicapped toilet? No   TIMED UP AND GO:  Was the  test performed? Yes .  Length of time to ambulate 10 feet: 8 sec.   Gait steady and fast without use of assistive device  Cognitive Function:        01/02/2023    8:53 AM  6CIT Screen  What Year? 0 points  What month? 0 points  What time? 0 points  Count back from 20 0 points  Months in reverse 0 points  Repeat phrase 0 points  Total Score 0 points    Immunizations Immunization History  Administered Date(s) Administered   Fluad Quad(high Dose 65+) 09/08/2022   Influenza-Unspecified 11/01/2020, 10/31/2021   PFIZER(Purple Top)SARS-COV-2 Vaccination 01/22/2020, 02/14/2020   Pfizer Covid-19 Vaccine Bivalent Booster 63yr & up 01/22/2020, 09/09/2021   Pneumococcal Conjugate-13 03/13/2017   Tdap 12/22/2012   Tetanus 10/20/2014    TDAP status: Up to date  Flu Vaccine status: Up to date  Pneumococcal vaccine status: Due, Education has been provided regarding the importance of this vaccine. Advised may receive this vaccine at local pharmacy or Health Dept. Aware to provide a copy of the vaccination record if obtained from local pharmacy or Health Dept. Verbalized acceptance and understanding.  Covid-19 vaccine status: Completed vaccines  Qualifies for Shingles Vaccine? Yes   Zostavax completed No   Shingrix Completed?: No.    Education has been provided regarding the importance of this vaccine. Patient has been advised to call insurance company to determine out of pocket expense if they have not yet received this vaccine. Advised may also receive vaccine at local pharmacy or Health Dept. Verbalized acceptance and understanding.  Screening Tests Health Maintenance  Topic Date Due   Hepatitis C Screening  Never done   Zoster Vaccines- Shingrix (1 of 2) Never done   Pneumonia Vaccine 62+ Years old (2 - PPSV23 or PCV20) 03/13/2018   COVID-19 Vaccine (5 - 2023-24 season) 08/01/2022   Medicare Annual Wellness (AWV)  01/03/2024   DTaP/Tdap/Td (3 - Td or Tdap) 10/20/2024    COLONOSCOPY (Pts 45-5yr Insurance coverage will need to be confirmed)  01/28/2029   HPV VACCINES  Aged Out   INFLUENZA VACCINE  Discontinued    Health Maintenance  Health Maintenance Due  Topic Date Due   Hepatitis C Screening  Never done   Zoster Vaccines- Shingrix (1 of 2) Never done   Pneumonia Vaccine 72 Years old (2 - PPSV23 or PCV20) 03/13/2018   COVID-19 Vaccine (5 - 2023-24 season) 08/01/2022    Colorectal cancer screening: Type of screening: Colonoscopy. Completed 01/28/2022. Repeat every 7 years  Lung Cancer Screening: (Low Dose CT Chest recommended if Age 72-80years, 30 pack-year currently smoking OR have quit w/in 15years.) does not qualify.   Lung Cancer Screening Referral: no  Additional Screening:  Hepatitis C Screening: does qualify; Completed no  Vision Screening: Recommended annual ophthalmology exams for early detection of glaucoma and other disorders of the eye. Is the patient up to date with their annual eye exam?  Yes  Who is the provider or what is the name of the office in which the patient attends annual eye exams? Keisha Smith,OD. If pt is not established with a provider, would they like to be referred to a provider to establish care? No .   Dental Screening: Recommended annual dental exams for proper oral hygiene  Community Resource Referral / Chronic Care Management: CRR required this visit?  No   CCM required this visit?  No      Plan:     I have personally reviewed and noted the following in the patient's chart:   Medical and social history Use of alcohol, tobacco or illicit drugs  Current medications and supplements including opioid prescriptions. Patient is not currently taking opioid prescriptions. Functional ability and status Nutritional status Physical activity Advanced directives List of other physicians Hospitalizations, surgeries, and ER visits in previous 12 months Vitals Screenings to include cognitive, depression, and  falls Referrals and appointments  In addition, I have reviewed and discussed with patient certain preventive protocols, quality metrics, and best practice recommendations. A written personalized care plan for preventive services as well as general preventive health recommendations were provided to patient.     SSheral Flow LPN   [id number]  Nurse Notes:  Normal cognitive status assessed by direct observation by this Nurse Health Advisor. No abnormalities found.    Medical screening examination/treatment/procedure(s) were performed by non-physician practitioner and as supervising physician I was immediately available for consultation/collaboration.  I agree with above. ALew Dawes MD

## 2023-01-02 NOTE — Patient Instructions (Signed)
Mr. Jorge Rogers , Thank you for taking time to come for your Medicare Wellness Visit. I appreciate your ongoing commitment to your health goals. Please review the following plan we discussed and let me know if I can assist you in the future.   These are the goals we discussed:  Goals      My goal for 2024 is to get back to physical activity.        This is a list of the screening recommended for you and due dates:  Health Maintenance  Topic Date Due   Hepatitis C Screening: USPSTF Recommendation to screen - Ages 67-79 yo.  Never done   Zoster (Shingles) Vaccine (1 of 2) Never done   Pneumonia Vaccine (2 - PPSV23 or PCV20) 03/13/2018   COVID-19 Vaccine (5 - 2023-24 season) 08/01/2022   Medicare Annual Wellness Visit  12/26/2022   DTaP/Tdap/Td vaccine (3 - Td or Tdap) 10/20/2024   Colon Cancer Screening  01/28/2029   HPV Vaccine  Aged Out   Flu Shot  Discontinued    Advanced directives: No  Conditions/risks identified: Yes  Next appointment: Follow up in one year for your annual wellness visit.   Preventive Care 55 Years and Older, Male  Preventive care refers to lifestyle choices and visits with your health care provider that can promote health and wellness. What does preventive care include? A yearly physical exam. This is also called an annual well check. Dental exams once or twice a year. Routine eye exams. Ask your health care provider how often you should have your eyes checked. Personal lifestyle choices, including: Daily care of your teeth and gums. Regular physical activity. Eating a healthy diet. Avoiding tobacco and drug use. Limiting alcohol use. Practicing safe sex. Taking low doses of aspirin every day. Taking vitamin and mineral supplements as recommended by your health care provider. What happens during an annual well check? The services and screenings done by your health care provider during your annual well check will depend on your age, overall health,  lifestyle risk factors, and family history of disease. Counseling  Your health care provider may ask you questions about your: Alcohol use. Tobacco use. Drug use. Emotional well-being. Home and relationship well-being. Sexual activity. Eating habits. History of falls. Memory and ability to understand (cognition). Work and work Statistician. Screening  You may have the following tests or measurements: Height, weight, and BMI. Blood pressure. Lipid and cholesterol levels. These may be checked every 5 years, or more frequently if you are over 76 years old. Skin check. Lung cancer screening. You may have this screening every year starting at age 70 if you have a 30-pack-year history of smoking and currently smoke or have quit within the past 15 years. Fecal occult blood test (FOBT) of the stool. You may have this test every year starting at age 82. Flexible sigmoidoscopy or colonoscopy. You may have a sigmoidoscopy every 5 years or a colonoscopy every 10 years starting at age 6. Prostate cancer screening. Recommendations will vary depending on your family history and other risks. Hepatitis C blood test. Hepatitis B blood test. Sexually transmitted disease (STD) testing. Diabetes screening. This is done by checking your blood sugar (glucose) after you have not eaten for a while (fasting). You may have this done every 1-3 years. Abdominal aortic aneurysm (AAA) screening. You may need this if you are a current or former smoker. Osteoporosis. You may be screened starting at age 110 if you are at high risk. Talk with  your health care provider about your test results, treatment options, and if necessary, the need for more tests. Vaccines  Your health care provider may recommend certain vaccines, such as: Influenza vaccine. This is recommended every year. Tetanus, diphtheria, and acellular pertussis (Tdap, Td) vaccine. You may need a Td booster every 10 years. Zoster vaccine. You may need this  after age 53. Pneumococcal 13-valent conjugate (PCV13) vaccine. One dose is recommended after age 69. Pneumococcal polysaccharide (PPSV23) vaccine. One dose is recommended after age 68. Talk to your health care provider about which screenings and vaccines you need and how often you need them. This information is not intended to replace advice given to you by your health care provider. Make sure you discuss any questions you have with your health care provider. Document Released: 12/14/2015 Document Revised: 08/06/2016 Document Reviewed: 09/18/2015 Elsevier Interactive Patient Education  2017 [employer] Prevention in the Home Falls can cause injuries. They can happen to people of all ages. There are many things you can do to make your home safe and to help prevent falls. What can I do on the outside of my home? Regularly fix the edges of walkways and driveways and fix any cracks. Remove anything that might make you trip as you walk through a door, such as a raised step or threshold. Trim any bushes or trees on the path to your home. Use bright outdoor lighting. Clear any walking paths of anything that might make someone trip, such as rocks or tools. Regularly check to see if handrails are loose or broken. Make sure that both sides of any steps have handrails. Any raised decks and porches should have guardrails on the edges. Have any leaves, snow, or ice cleared regularly. Use sand or salt on walking paths during winter. Clean up any spills in your garage right away. This includes oil or grease spills. What can I do in the bathroom? Use night lights. Install grab bars by the toilet and in the tub and shower. Do not use towel bars as grab bars. Use non-skid mats or decals in the tub or shower. If you need to sit down in the shower, use a plastic, non-slip stool. Keep the floor dry. Clean up any water that spills on the floor as soon as it happens. Remove soap buildup in the tub or  shower regularly. Attach bath mats securely with double-sided non-slip rug tape. Do not have throw rugs and other things on the floor that can make you trip. What can I do in the bedroom? Use night lights. Make sure that you have a light by your bed that is easy to reach. Do not use any sheets or blankets that are too big for your bed. They should not hang down onto the floor. Have a firm chair that has side arms. You can use this for support while you get dressed. Do not have throw rugs and other things on the floor that can make you trip. What can I do in the kitchen? Clean up any spills right away. Avoid walking on wet floors. Keep items that you use a lot in easy-to-reach places. If you need to reach something above you, use a strong step stool that has a grab bar. Keep electrical cords out of the way. Do not use floor polish or wax that makes floors slippery. If you must use wax, use non-skid floor wax. Do not have throw rugs and other things on the floor that can make you trip.  What can I do with my stairs? Do not leave any items on the stairs. Make sure that there are handrails on both sides of the stairs and use them. Fix handrails that are broken or loose. Make sure that handrails are as long as the stairways. Check any carpeting to make sure that it is firmly attached to the stairs. Fix any carpet that is loose or worn. Avoid having throw rugs at the top or bottom of the stairs. If you do have throw rugs, attach them to the floor with carpet tape. Make sure that you have a light switch at the top of the stairs and the bottom of the stairs. If you do not have them, ask someone to add them for you. What else can I do to help prevent falls? Wear shoes that: Do not have high heels. Have rubber bottoms. Are comfortable and fit you well. Are closed at the toe. Do not wear sandals. If you use a stepladder: Make sure that it is fully opened. Do not climb a closed stepladder. Make  sure that both sides of the stepladder are locked into place. Ask someone to hold it for you, if possible. Clearly mark and make sure that you can see: Any grab bars or handrails. First and last steps. Where the edge of each step is. Use tools that help you move around (mobility aids) if they are needed. These include: Canes. Walkers. Scooters. Crutches. Turn on the lights when you go into a dark area. Replace any light bulbs as soon as they burn out. Set up your furniture so you have a clear path. Avoid moving your furniture around. If any of your floors are uneven, fix them. If there are any pets around you, be aware of where they are. Review your medicines with your doctor. Some medicines can make you feel dizzy. This can increase your chance of falling. Ask your doctor what other things that you can do to help prevent falls. This information is not intended to replace advice given to you by your health care provider. Make sure you discuss any questions you have with your health care provider. Document Released: 09/13/2009 Document Revised: 04/24/2016 Document Reviewed: 12/22/2014 Elsevier Interactive Patient Education  2017 [employer].

## 2023-01-06 DIAGNOSIS — H2513 Age-related nuclear cataract, bilateral: Secondary | ICD-10-CM | POA: Diagnosis not present

## 2023-01-06 DIAGNOSIS — H524 Presbyopia: Secondary | ICD-10-CM | POA: Diagnosis not present

## 2023-01-09 DIAGNOSIS — H524 Presbyopia: Secondary | ICD-10-CM | POA: Diagnosis not present

## 2023-01-09 DIAGNOSIS — H52223 Regular astigmatism, bilateral: Secondary | ICD-10-CM | POA: Diagnosis not present

## 2023-01-26 ENCOUNTER — Ambulatory Visit (INDEPENDENT_AMBULATORY_CARE_PROVIDER_SITE_OTHER): Payer: Medicare HMO | Admitting: Internal Medicine

## 2023-01-26 ENCOUNTER — Encounter: Payer: Self-pay | Admitting: Internal Medicine

## 2023-01-26 VITALS — BP 118/70 | HR 85 | Temp 98.0°F | Ht 71.0 in

## 2023-01-26 DIAGNOSIS — N183 Chronic kidney disease, stage 3 unspecified: Secondary | ICD-10-CM | POA: Diagnosis not present

## 2023-01-26 DIAGNOSIS — N10 Acute tubulo-interstitial nephritis: Secondary | ICD-10-CM

## 2023-01-26 DIAGNOSIS — R300 Dysuria: Secondary | ICD-10-CM

## 2023-01-26 DIAGNOSIS — N342 Other urethritis: Secondary | ICD-10-CM | POA: Diagnosis not present

## 2023-01-26 LAB — URINALYSIS, ROUTINE W REFLEX MICROSCOPIC
Bilirubin Urine: NEGATIVE
Ketones, ur: NEGATIVE
Nitrite: NEGATIVE
Specific Gravity, Urine: 1.025 (ref 1.000–1.030)
Total Protein, Urine: NEGATIVE
Urine Glucose: NEGATIVE
Urobilinogen, UA: 0.2 (ref 0.0–1.0)
pH: 6 (ref 5.0–8.0)

## 2023-01-26 LAB — COMPREHENSIVE METABOLIC PANEL
ALT: 21 U/L (ref 0–53)
AST: 24 U/L (ref 0–37)
Albumin: 4.2 g/dL (ref 3.5–5.2)
Alkaline Phosphatase: 69 U/L (ref 39–117)
BUN: 18 mg/dL (ref 6–23)
CO2: 29 mEq/L (ref 19–32)
Calcium: 10.3 mg/dL (ref 8.4–10.5)
Chloride: 101 mEq/L (ref 96–112)
Creatinine, Ser: 1.21 mg/dL (ref 0.40–1.50)
GFR: 60.23 mL/min (ref >60.00)
Glucose, Bld: 83 mg/dL (ref 70–99)
Potassium: 4.1 mEq/L (ref 3.5–5.1)
Sodium: 139 mEq/L (ref 135–145)
Total Bilirubin: 0.5 mg/dL (ref 0.2–1.2)
Total Protein: 7.9 g/dL (ref 6.0–8.3)

## 2023-01-26 LAB — CBC WITH DIFFERENTIAL/PLATELET
Basophils Absolute: 0 10*3/uL (ref 0.0–0.1)
Basophils Relative: 0.6 % (ref 0.0–3.0)
Eosinophils Absolute: 0.2 10*3/uL (ref 0.0–0.7)
Eosinophils Relative: 3.1 % (ref 0.0–5.0)
HCT: 45.2 % (ref 39.0–52.0)
Hemoglobin: 15.7 g/dL (ref 13.0–17.0)
Lymphocytes Relative: 36.1 % (ref 12.0–46.0)
Lymphs Abs: 2.2 10*3/uL (ref 0.7–4.0)
MCHC: 34.8 g/dL (ref 30.0–36.0)
MCV: 92.9 fl (ref 78.0–100.0)
Monocytes Absolute: 1 10*3/uL (ref 0.1–1.0)
Monocytes Relative: 16.6 % — ABNORMAL HIGH (ref 3.0–12.0)
Neutro Abs: 2.6 10*3/uL (ref 1.4–7.7)
Neutrophils Relative %: 43.6 % (ref 43.0–77.0)
Platelets: 238 10*3/uL (ref 150.0–400.0)
RBC: 4.87 Mil/uL (ref 4.22–5.81)
RDW: 13.5 % (ref 11.5–15.5)
WBC: 6 10*3/uL (ref 4.0–10.5)

## 2023-01-26 MED ORDER — DOXYCYCLINE HYCLATE 100 MG PO TABS: 100.0000 mg | ORAL_TABLET | Freq: Two times a day (BID) | ORAL | 0 refills | Status: DC

## 2023-01-26 NOTE — Progress Notes (Signed)
Subjective:  Patient ID: Jorge Rogers, male    DOB: [birth year]  Age: 72 y.o. MRN: [id number]  CC: No chief complaint on file.   HPI Jorge Rogers presents for dysuria, incontinence (dribbling). Pt had a UTI 1 month ago (UC), then re-started. He had a UTI in 12/2022 - treated w/Cipro x 10 d. Urine Cx w/E coli - sensitive. Pt had some fever.Marland KitchenMarland KitchenA very remote h/o STD (when he was very young). No d/c  Outpatient Medications Prior to Visit  Medication Sig Dispense Refill   aspirin 81 MG tablet Take 81 mg by mouth daily.     Cholecalciferol (VITAMIN D3) 50 MCG (2000 UT) capsule Take 1 capsule (2,000 Units total) by mouth daily. 100 capsule 3   fluticasone (FLONASE) 50 MCG/ACT nasal spray Place 2 sprays into both nostrils daily. 16 g 6   pantoprazole (PROTONIX) 40 MG tablet Take 1 tablet (40 mg total) by mouth daily. 90 tablet 3   rosuvastatin (CRESTOR) 5 MG tablet TAKE 1 TABLET EVERY DAY. ANNUAL APPT DUE IN OCT. MUST SEE MD FOR FUTURE REFILLS. 90 tablet 3   triamcinolone ointment (KENALOG) 0.1 % Apply 1 application topically 2 (two) times daily. 80 g 2   No facility-administered medications prior to visit.    ROS: Review of Systems  Constitutional:  Negative for appetite change, fatigue and unexpected weight change.  HENT:  Negative for congestion, nosebleeds, sneezing, sore throat and trouble swallowing.   Eyes:  Negative for itching and visual disturbance.  Respiratory:  Negative for cough.   Cardiovascular:  Negative for chest pain, palpitations and leg swelling.  Gastrointestinal:  Negative for abdominal distention, blood in stool, diarrhea and nausea.  Genitourinary:  Positive for dysuria. Negative for decreased urine volume, frequency, genital sores and hematuria.  Musculoskeletal:  Negative for back pain, gait problem, joint swelling and neck pain.  Skin:  Negative for rash.  Neurological:  Negative for dizziness, tremors, speech difficulty and weakness.  Psychiatric/Behavioral:   Negative for agitation, dysphoric mood and sleep disturbance. The patient is not nervous/anxious.     Objective:  BP 118/70 (BP Location: Right Arm, Patient Position: Sitting, Cuff Size: Large)   Pulse 85   Temp 98 F (36.7 C) (Oral)   Ht '5\' 11"'$  (1.803 m)   SpO2 98%   BMI 28.84 kg/m   BP Readings from Last 3 Encounters:  01/26/23 118/70  01/02/23 116/70  09/11/22 130/78    Wt Readings from Last 3 Encounters:  01/02/23 206 lb 12.8 oz (93.8 kg)  09/11/22 200 lb 3.2 oz (90.8 kg)  05/02/22 196 lb 8 oz (89.1 kg)    Physical Exam Constitutional:      General: He is not in acute distress.    Appearance: Normal appearance. He is well-developed.     Comments: NAD  Eyes:     Conjunctiva/sclera: Conjunctivae normal.     Pupils: Pupils are equal, round, and reactive to light.  Neck:     Thyroid: No thyromegaly.     Vascular: No JVD.  Cardiovascular:     Rate and Rhythm: Normal rate and regular rhythm.     Heart sounds: Normal heart sounds. No murmur heard.    No friction rub. No gallop.  Pulmonary:     Effort: Pulmonary effort is normal. No respiratory distress.     Breath sounds: Normal breath sounds. No wheezing or rales.  Chest:     Chest wall: No tenderness.  Abdominal:     General: Bowel  sounds are normal. There is no distension.     Palpations: Abdomen is soft. There is no mass.     Tenderness: There is no abdominal tenderness. There is no guarding or rebound.  Genitourinary:    Penis: Normal.      Testes: Normal.  Musculoskeletal:        General: No tenderness. Normal range of motion.     Cervical back: Normal range of motion.  Lymphadenopathy:     Cervical: No cervical adenopathy.  Skin:    General: Skin is warm and dry.     Findings: No rash.  Neurological:     Mental Status: He is alert and oriented to person, place, and time.     Cranial Nerves: No cranial nerve deficit.     Motor: No abnormal muscle tone.     Coordination: Coordination normal.      Gait: Gait normal.     Deep Tendon Reflexes: Reflexes are normal and symmetric.  Psychiatric:        Behavior: Behavior normal.        Thought Content: Thought content normal.        Judgment: Judgment normal.   Prostate exam - per Urology  Lab Results  Component Value Date   WBC 5.8 09/11/2022   HGB 15.4 09/11/2022   HCT 45.4 09/11/2022   PLT 189.0 09/11/2022   GLUCOSE 78 09/11/2022   CHOL 142 09/11/2022   TRIG 67.0 09/11/2022   HDL 58.90 09/11/2022   LDLCALC 69 09/11/2022   ALT 15 09/11/2022   AST 21 09/11/2022   NA 140 09/11/2022   K 4.4 09/11/2022   CL 104 09/11/2022   CREATININE 1.25 09/11/2022   BUN 16 09/11/2022   CO2 30 09/11/2022   TSH 1.37 09/11/2022   PSA 1.32 09/11/2022   INR 2.2 RATIO (H) 07/15/2007   MICROALBUR 35.7 Repeated and verified X2. (H) 06/20/2013    US RENAL  Result Date: 09/25/2021 CLINICAL DATA:  Renal insufficiency. EXAM: RENAL / URINARY TRACT ULTRASOUND COMPLETE COMPARISON:  None. FINDINGS: Right Kidney: Renal measurements: 9.0 cm x 5.4 cm x 5.4 cm = volume: 135 mL. Echogenicity within normal limits. No mass or hydronephrosis visualized. Left Kidney: Renal measurements: 10.0 cm x 5.6 cm x 5.0 cm = volume: 145 mL. Echogenicity within normal limits. No mass or hydronephrosis visualized. Bladder: Appears normal for degree of bladder distention. Other: None. IMPRESSION: Normal renal ultrasound. Electronically Signed   By: Virgina Norfolk M.D.   On: 09/25/2021 20:49    Assessment & Plan:   Problem List Items Addressed This Visit       Genitourinary   UTI (urinary tract infection) - Primary    Recurrent.  Recent dysuria, incontinence (dribbling). Pt had a UTI 1 month ago (UC), then re-started. He had a UTI in 12/2022 - treated w/Cipro x 10 d. Urine Cx w/E coli - sensitive. Pt had some fever.Marland KitchenMarland KitchenA very remote h/o STD (when he was very young) UA/Cx Urine for GC/Clam Cipro x 3 weeks Urol ref      Relevant Orders   CBC with  Differential/Platelet   Comprehensive metabolic panel   GC/Chlamydia Probe Amp   Urinalysis   CULTURE, URINE COMPREHENSIVE   Ambulatory referral to Urology   CRI (chronic renal insufficiency), stage 3 (moderate) (HCC)    Check CMET      Relevant Orders   CBC with Differential/Platelet   Comprehensive metabolic panel   Other Visit Diagnoses     Dysuria  Relevant Orders   CBC with Differential/Platelet   Comprehensive metabolic panel   GC/Chlamydia Probe Amp   Urinalysis   CULTURE, URINE COMPREHENSIVE   HIV antibody (with reflex)   Ambulatory referral to Urology         Meds ordered this encounter  Medications   doxycycline (VIBRA-TABS) 100 MG tablet    Sig: Take 1 tablet (100 mg total) by mouth 2 (two) times daily.    Dispense:  42 tablet    Refill:  0      Follow-up: Return in about 3 months (around 04/26/2023) for a follow-up visit.  Walker Kehr, MD

## 2023-01-26 NOTE — Assessment & Plan Note (Signed)
Check CMET. 

## 2023-01-26 NOTE — Assessment & Plan Note (Addendum)
Recurrent.  Recent dysuria, incontinence (dribbling). Pt had a UTI 1 month ago (UC), then re-started. He had a UTI in 12/2022 - treated w/Cipro x 10 d. Urine Cx w/E coli - sensitive. Pt had some fever.Marland KitchenMarland KitchenA very remote h/o STD (when he was very young) UA/Cx Urine for GC/Clam Cipro x 3 weeks Urol ref

## 2023-01-28 LAB — GC/CHLAMYDIA PROBE AMP
Chlamydia trachomatis, NAA: NEGATIVE
Neisseria Gonorrhoeae by PCR: NEGATIVE

## 2023-01-29 LAB — CULTURE, URINE COMPREHENSIVE: RESULT:: NO GROWTH

## 2023-01-29 LAB — HIV ANTIBODY (ROUTINE TESTING W REFLEX): HIV 1&2 Ab, 4th Generation: NONREACTIVE

## 2023-07-31 ENCOUNTER — Other Ambulatory Visit: Payer: Self-pay | Admitting: Internal Medicine

## 2023-07-31 DIAGNOSIS — K2100 Gastro-esophageal reflux disease with esophagitis, without bleeding: Secondary | ICD-10-CM

## 2023-09-02 ENCOUNTER — Other Ambulatory Visit: Payer: Self-pay | Admitting: Internal Medicine

## 2023-09-04 DIAGNOSIS — N3001 Acute cystitis with hematuria: Secondary | ICD-10-CM | POA: Diagnosis not present

## 2023-09-28 DIAGNOSIS — N401 Enlarged prostate with lower urinary tract symptoms: Secondary | ICD-10-CM | POA: Diagnosis not present

## 2023-09-28 DIAGNOSIS — R351 Nocturia: Secondary | ICD-10-CM | POA: Diagnosis not present

## 2023-09-28 DIAGNOSIS — R311 Benign essential microscopic hematuria: Secondary | ICD-10-CM | POA: Diagnosis not present

## 2023-09-28 DIAGNOSIS — R300 Dysuria: Secondary | ICD-10-CM | POA: Diagnosis not present

## 2023-10-05 DIAGNOSIS — R300 Dysuria: Secondary | ICD-10-CM | POA: Diagnosis not present

## 2023-10-05 DIAGNOSIS — N3001 Acute cystitis with hematuria: Secondary | ICD-10-CM | POA: Diagnosis not present

## 2023-10-21 DIAGNOSIS — H52209 Unspecified astigmatism, unspecified eye: Secondary | ICD-10-CM | POA: Diagnosis not present

## 2023-10-21 DIAGNOSIS — H5213 Myopia, bilateral: Secondary | ICD-10-CM | POA: Diagnosis not present

## 2023-10-23 DIAGNOSIS — N3289 Other specified disorders of bladder: Secondary | ICD-10-CM | POA: Diagnosis not present

## 2023-10-23 DIAGNOSIS — K5730 Diverticulosis of large intestine without perforation or abscess without bleeding: Secondary | ICD-10-CM | POA: Diagnosis not present

## 2023-10-23 DIAGNOSIS — R3129 Other microscopic hematuria: Secondary | ICD-10-CM | POA: Diagnosis not present

## 2023-10-23 DIAGNOSIS — R311 Benign essential microscopic hematuria: Secondary | ICD-10-CM | POA: Diagnosis not present

## 2023-11-06 DIAGNOSIS — R311 Benign essential microscopic hematuria: Secondary | ICD-10-CM | POA: Diagnosis not present

## 2023-12-23 ENCOUNTER — Ambulatory Visit: Payer: Medicare HMO | Admitting: Internal Medicine

## 2023-12-23 ENCOUNTER — Encounter: Payer: Self-pay | Admitting: Internal Medicine

## 2023-12-23 VITALS — BP 110/68 | HR 81 | Temp 98.1°F | Ht 71.0 in | Wt 211.0 lb

## 2023-12-23 DIAGNOSIS — I2510 Atherosclerotic heart disease of native coronary artery without angina pectoris: Secondary | ICD-10-CM

## 2023-12-23 DIAGNOSIS — N183 Chronic kidney disease, stage 3 unspecified: Secondary | ICD-10-CM

## 2023-12-23 DIAGNOSIS — I2583 Coronary atherosclerosis due to lipid rich plaque: Secondary | ICD-10-CM

## 2023-12-23 DIAGNOSIS — Z0000 Encounter for general adult medical examination without abnormal findings: Secondary | ICD-10-CM

## 2023-12-23 LAB — COMPREHENSIVE METABOLIC PANEL
ALT: 19 U/L (ref 0–53)
AST: 22 U/L (ref 0–37)
Albumin: 4.7 g/dL (ref 3.5–5.2)
Alkaline Phosphatase: 56 U/L (ref 39–117)
BUN: 11 mg/dL (ref 6–23)
CO2: 29 meq/L (ref 19–32)
Calcium: 10.1 mg/dL (ref 8.4–10.5)
Chloride: 104 meq/L (ref 96–112)
Creatinine, Ser: 1.25 mg/dL (ref 0.40–1.50)
GFR: 57.56 mL/min — ABNORMAL LOW (ref >60.00)
Glucose, Bld: 52 mg/dL — ABNORMAL LOW (ref 70–99)
Potassium: 4.3 meq/L (ref 3.5–5.1)
Sodium: 141 meq/L (ref 135–145)
Total Bilirubin: 0.6 mg/dL (ref 0.2–1.2)
Total Protein: 7.6 g/dL (ref 6.0–8.3)

## 2023-12-23 LAB — URINALYSIS, ROUTINE W REFLEX MICROSCOPIC
Bilirubin Urine: NEGATIVE
Ketones, ur: NEGATIVE
Leukocytes,Ua: NEGATIVE
Nitrite: NEGATIVE
Specific Gravity, Urine: 1.02 (ref 1.000–1.030)
Total Protein, Urine: NEGATIVE
Urine Glucose: NEGATIVE
Urobilinogen, UA: 0.2 (ref 0.0–1.0)
pH: 6.5 (ref 5.0–8.0)

## 2023-12-23 LAB — LIPID PANEL
Cholesterol: 148 mg/dL (ref 0–200)
HDL: 58 mg/dL (ref >39.00)
LDL Cholesterol: 62 mg/dL (ref 0–99)
NonHDL: 89.61
Total CHOL/HDL Ratio: 3
Triglycerides: 137 mg/dL (ref 0.0–149.0)
VLDL: 27.4 mg/dL (ref 0.0–40.0)

## 2023-12-23 LAB — CBC WITH DIFFERENTIAL/PLATELET
Basophils Absolute: 0 10*3/uL (ref 0.0–0.1)
Basophils Relative: 0.5 % (ref 0.0–3.0)
Eosinophils Absolute: 0.1 10*3/uL (ref 0.0–0.7)
Eosinophils Relative: 2.1 % (ref 0.0–5.0)
HCT: 47.8 % (ref 39.0–52.0)
Hemoglobin: 16 g/dL (ref 13.0–17.0)
Lymphocytes Relative: 35.6 % (ref 12.0–46.0)
Lymphs Abs: 2.5 10*3/uL (ref 0.7–4.0)
MCHC: 33.5 g/dL (ref 30.0–36.0)
MCV: 96 fL (ref 78.0–100.0)
Monocytes Absolute: 0.7 10*3/uL (ref 0.1–1.0)
Monocytes Relative: 10.1 % (ref 3.0–12.0)
Neutro Abs: 3.6 10*3/uL (ref 1.4–7.7)
Neutrophils Relative %: 51.7 % (ref 43.0–77.0)
Platelets: 199 10*3/uL (ref 150.0–400.0)
RBC: 4.98 Mil/uL (ref 4.22–5.81)
RDW: 14.1 % (ref 11.5–15.5)
WBC: 6.9 10*3/uL (ref 4.0–10.5)

## 2023-12-23 LAB — TSH: TSH: 3.79 u[IU]/mL (ref 0.35–5.50)

## 2023-12-23 NOTE — Assessment & Plan Note (Signed)
Crestor - low dose 

## 2023-12-23 NOTE — Progress Notes (Signed)
Subjective:  Patient ID: Jorge Rogers, male    DOB: 03/07/1951  Age: 73 y.o. MRN: [id number]  CC: Medical Management of Chronic Issues   HPI Zigmund Mccarney Kutz presents for a well exam C/o wt gain Exercising regular  Outpatient Medications Prior to Visit  Medication Sig Dispense Refill   aspirin 81 MG tablet Take 81 mg by mouth daily.     Cholecalciferol (VITAMIN D3) 50 MCG (2000 UT) capsule Take 1 capsule (2,000 Units total) by mouth daily. 100 capsule 3   fluticasone (FLONASE) 50 MCG/ACT nasal spray Place 2 sprays into both nostrils daily. 16 g 6   pantoprazole (PROTONIX) 40 MG tablet TAKE 1 TABLET BY MOUTH EVERY DAY 90 tablet 3   rosuvastatin (CRESTOR) 5 MG tablet TAKE 1 TABLET EVERY DAY. ANNUAL APPT DUE IN OCT. MUST SEE MD FOR FUTURE REFILLS. 90 tablet 1   triamcinolone ointment (KENALOG) 0.1 % Apply 1 application topically 2 (two) times daily. 80 g 2   doxycycline (VIBRA-TABS) 100 MG tablet Take 1 tablet (100 mg total) by mouth 2 (two) times daily. (Patient not taking: Reported on 12/23/2023) 42 tablet 0   No facility-administered medications prior to visit.    ROS: Review of Systems  Constitutional:  Positive for unexpected weight change. Negative for appetite change and fatigue.  HENT:  Negative for congestion, nosebleeds, sneezing, sore throat and trouble swallowing.   Eyes:  Negative for itching and visual disturbance.  Respiratory:  Negative for cough.   Cardiovascular:  Negative for chest pain, palpitations and leg swelling.  Gastrointestinal:  Negative for abdominal distention, blood in stool, diarrhea and nausea.  Genitourinary:  Negative for frequency and hematuria.  Musculoskeletal:  Negative for back pain, gait problem, joint swelling and neck pain.  Skin:  Negative for rash.  Neurological:  Negative for dizziness, tremors, speech difficulty and weakness.  Psychiatric/Behavioral:  Negative for agitation, dysphoric mood and sleep disturbance. The patient is not  nervous/anxious.     Objective:  BP 110/68 (BP Location: Left Arm, Patient Position: Sitting, Cuff Size: Normal)   Pulse 81   Temp 98.1 F (36.7 C) (Oral)   Ht 5\' 11"  (1.803 m)   Wt 211 lb (95.7 kg)   SpO2 98%   BMI 29.43 kg/m   BP Readings from Last 3 Encounters:  12/23/23 110/68  01/26/23 118/70  01/02/23 116/70    Wt Readings from Last 3 Encounters:  12/23/23 211 lb (95.7 kg)  01/02/23 206 lb 12.8 oz (93.8 kg)  09/11/22 200 lb 3.2 oz (90.8 kg)    Physical Exam Constitutional:      General: He is not in acute distress.    Appearance: He is well-developed.     Comments: NAD  Eyes:     Conjunctiva/sclera: Conjunctivae normal.     Pupils: Pupils are equal, round, and reactive to light.  Neck:     Thyroid: No thyromegaly.     Vascular: No JVD.  Cardiovascular:     Rate and Rhythm: Normal rate and regular rhythm.     Heart sounds: Normal heart sounds. No murmur heard.    No friction rub. No gallop.  Pulmonary:     Effort: Pulmonary effort is normal. No respiratory distress.     Breath sounds: Normal breath sounds. No wheezing or rales.  Chest:     Chest wall: No tenderness.  Abdominal:     General: Bowel sounds are normal. There is no distension.     Palpations: Abdomen is soft.  There is no mass.     Tenderness: There is no abdominal tenderness. There is no guarding or rebound.  Musculoskeletal:        General: No tenderness. Normal range of motion.     Cervical back: Normal range of motion.  Lymphadenopathy:     Cervical: No cervical adenopathy.  Skin:    General: Skin is warm and dry.     Findings: No rash.  Neurological:     Mental Status: He is alert and oriented to person, place, and time.     Cranial Nerves: No cranial nerve deficit.     Motor: No abnormal muscle tone.     Coordination: Coordination normal.     Gait: Gait normal.     Deep Tendon Reflexes: Reflexes are normal and symmetric.  Psychiatric:        Behavior: Behavior normal.         Thought Content: Thought content normal.        Judgment: Judgment normal.   Rectal - per Urology  Lab Results  Component Value Date   WBC 6.0 01/26/2023   HGB 15.7 01/26/2023   HCT 45.2 01/26/2023   PLT 238.0 01/26/2023   GLUCOSE 83 01/26/2023   CHOL 142 09/11/2022   TRIG 67.0 09/11/2022   HDL 58.90 09/11/2022   LDLCALC 69 09/11/2022   ALT 21 01/26/2023   AST 24 01/26/2023   NA 139 01/26/2023   K 4.1 01/26/2023   CL 101 01/26/2023   CREATININE 1.21 01/26/2023   BUN 18 01/26/2023   CO2 29 01/26/2023   TSH 1.37 09/11/2022   PSA 1.32 09/11/2022   INR 2.2 RATIO (H) 07/15/2007   MICROALBUR 35.7 Repeated and verified X2. (H) 06/20/2013    US RENAL Result Date: 09/25/2021 CLINICAL DATA:  Renal insufficiency. EXAM: RENAL / URINARY TRACT ULTRASOUND COMPLETE COMPARISON:  None. FINDINGS: Right Kidney: Renal measurements: 9.0 cm x 5.4 cm x 5.4 cm = volume: 135 mL. Echogenicity within normal limits. No mass or hydronephrosis visualized. Left Kidney: Renal measurements: 10.0 cm x 5.6 cm x 5.0 cm = volume: 145 mL. Echogenicity within normal limits. No mass or hydronephrosis visualized. Bladder: Appears normal for degree of bladder distention. Other: None. IMPRESSION: Normal renal ultrasound. Electronically Signed   By: Aram Candela M.D.   On: 09/25/2021 20:49    Assessment & Plan:   Problem List Items Addressed This Visit     Well adult exam - Primary    We discussed age appropriate health related issues, including available/recomended screening tests and vaccinations. Labs were ordered to be later reviewed . All questions were answered. We discussed one or more of the following - seat belt use, use of sunscreen/sun exposure exercise, safe sex, fall risk reduction, second hand smoke exposure, firearm use and storage, seat belt use, a need for adhering to healthy diet and exercise. Labs were ordered.  All questions were answered. Rectal, PSA - per Urology Cor calc CT score is 4 ---  10/21 Last colon 2023 - due in 7-10 years       Relevant Orders   TSH   Urinalysis   CBC with Differential/Platelet   Lipid panel   Comprehensive metabolic panel   Coronary atherosclerosis due to lipid rich plaque   Crestor low dose      Relevant Orders   TSH   Urinalysis   CBC with Differential/Platelet   Lipid panel   Comprehensive metabolic panel   CRI (chronic renal insufficiency), stage 3 (moderate) (  HCC)   Check CMET      Relevant Orders   TSH   Urinalysis   CBC with Differential/Platelet   Lipid panel   Comprehensive metabolic panel      No orders of the defined types were placed in this encounter.     Follow-up: No follow-ups on file.  Sonda Primes, MD

## 2023-12-23 NOTE — Assessment & Plan Note (Signed)
Check CMET. 

## 2023-12-23 NOTE — Assessment & Plan Note (Addendum)
  We discussed age appropriate health related issues, including available/recomended screening tests and vaccinations. Labs were ordered to be later reviewed . All questions were answered. We discussed one or more of the following - seat belt use, use of sunscreen/sun exposure exercise, safe sex, fall risk reduction, second hand smoke exposure, firearm use and storage, seat belt use, a need for adhering to healthy diet and exercise. Labs were ordered.  All questions were answered. Rectal, PSA - per Urology Cor calc CT score is 4 --- 10/21 Last colon 2023 - due in 7-10 years

## 2023-12-23 NOTE — Patient Instructions (Signed)
 Jorge Rogers

## 2023-12-24 ENCOUNTER — Encounter: Payer: Self-pay | Admitting: Internal Medicine

## 2024-01-27 ENCOUNTER — Other Ambulatory Visit: Payer: Self-pay | Admitting: Internal Medicine

## 2024-02-01 ENCOUNTER — Telehealth: Payer: Self-pay

## 2024-02-01 NOTE — Telephone Encounter (Addendum)
 Pt due for AWV w/NHA  Medical screening examination/treatment/procedure(s) were performed by non-physician practitioner and as supervising physician I was immediately available for consultation/collaboration.  I agree with above. Jacinta Shoe, MD

## 2024-02-05 ENCOUNTER — Ambulatory Visit

## 2024-02-05 VITALS — BP 122/62 | HR 70 | Ht 70.5 in | Wt 205.6 lb

## 2024-02-05 DIAGNOSIS — Z0000 Encounter for general adult medical examination without abnormal findings: Secondary | ICD-10-CM | POA: Diagnosis not present

## 2024-02-05 NOTE — Progress Notes (Cosign Needed Addendum)
 Subjective:   Jorge Rogers is a 73 y.o. who presents for a Medicare Wellness preventive visit.  Visit Complete: In person   AWV Questionnaire: No: Patient Medicare AWV questionnaire was not completed prior to this visit.  Cardiac Risk Factors include: advanced age (>24men, >48 women);male gender     Objective:    Today's Vitals   02/05/24 1309  BP: 122/62  Pulse: 70  SpO2: 98%  Weight: 205 lb 9.6 oz (93.3 kg)  Height: 5' 10.5" (1.791 m)   Body mass index is 29.08 kg/m.     02/05/2024    1:08 PM 01/02/2023    9:00 AM 12/26/2021    9:32 AM 12/25/2020    9:04 AM 11/29/2014    9:37 PM  Advanced Directives  Does Patient Have a Medical Advance Directive? No No No No No  Type of Surveyor, minerals;Living will    Does patient want to make changes to medical advance directive?   Yes (MAU/Ambulatory/Procedural Areas - Information given)    Would patient like information on creating a medical advance directive? Yes (MAU/Ambulatory/Procedural Areas - Information given) No - Patient declined  No - Patient declined No - patient declined information    Current Medications (verified) Outpatient Encounter Medications as of 02/05/2024  Medication Sig   aspirin 81 MG tablet Take 81 mg by mouth daily.   Cholecalciferol (VITAMIN D3) 50 MCG (2000 UT) capsule Take 1 capsule (2,000 Units total) by mouth daily.   fluticasone (FLONASE) 50 MCG/ACT nasal spray Place 2 sprays into both nostrils daily.   pantoprazole (PROTONIX) 40 MG tablet TAKE 1 TABLET BY MOUTH EVERY DAY   rosuvastatin (CRESTOR) 5 MG tablet TAKE 1 TABLET EVERY DAY. ANNUAL APPT DUE IN OCT. MUST SEE MD FOR FUTURE REFILLS.   triamcinolone ointment (KENALOG) 0.1 % Apply 1 application topically 2 (two) times daily.   No facility-administered encounter medications on file as of 02/05/2024.    Allergies (verified) Patient has no known allergies.   History: Past Medical History:  Diagnosis Date    Arthritis    knees   Hyperlipidemia    Pulmonary embolism (HCC) 07/02/2007   B PE and RLE DVT   Past Surgical History:  Procedure Laterality Date   ACHILLES TENDON REPAIR     COLONOSCOPY     UPPER GASTROINTESTINAL ENDOSCOPY     Family History  Problem Relation Age of Onset   Heart disease Father 74       card arrest   Colon cancer Neg Hx    Esophageal cancer Neg Hx    Rectal cancer Neg Hx    Stomach cancer Neg Hx    Social History   Socioeconomic History   Marital status: Married    Spouse name: Not on file   Number of children: 3   Years of education: Not on file   Highest education level: Some college, no degree  Occupational History   Not on file  Tobacco Use   Smoking status: Never    Passive exposure: Never   Smokeless tobacco: Never  Vaping Use   Vaping status: Never Used  Substance and Sexual Activity   Alcohol use: Yes    Alcohol/week: 1.0 standard drink of alcohol    Types: 1 Shots of liquor per week    Comment: occ - scotch   Drug use: No   Sexual activity: Yes  Other Topics Concern   Not on file  Social History Narrative  Not on file   Social Drivers of Health   Financial Resource Strain: Low Risk  (02/05/2024)   Overall Financial Resource Strain (CARDIA)    Difficulty of Paying Living Expenses: Not hard at all  Food Insecurity: No Food Insecurity (02/05/2024)   Hunger Vital Sign    Worried About Running Out of Food in the Last Year: Never true    Ran Out of Food in the Last Year: Never true  Transportation Needs: No Transportation Needs (02/05/2024)   PRAPARE - Administrator, Civil Service (Medical): No    Lack of Transportation (Non-Medical): No  Physical Activity: Insufficiently Active (02/05/2024)   Exercise Vital Sign    Days of Exercise per Week: 3 days    Minutes of Exercise per Session: 30 min  Stress: No Stress Concern Present (02/05/2024)   [employer] of Occupational Health - Occupational Stress Questionnaire     Feeling of Stress : Not at all  Social Connections: Moderately Integrated (02/05/2024)   Social Connection and Isolation Panel [NHANES]    Frequency of Communication with Friends and Family: More than three times a week    Frequency of Social Gatherings with Friends and Family: More than three times a week    Attends Religious Services: More than 4 times per year    Active Member of [employer] or Organizations: No    Attends Banker Meetings: Never    Marital Status: Married    Tobacco Counseling - Non Smoker Counseling given - N/A    Clinical Intake:  Pre-visit preparation completed: Yes  Pain : No/denies pain     BMI - recorded: 29.08 Nutritional Status: BMI 25 -29 Overweight Nutritional Risks: None Diabetes: No  How often do you need to have someone help you when you read instructions, pamphlets, or other written materials from your doctor or pharmacy?: 1 - Never  Interpreter Needed?: No  Information entered by :: Hassell Halim, CMA  Activities of Daily Living     02/05/2024    1:14 PM  In your present state of health, do you have any difficulty performing the following activities:  Hearing? 0  Vision? 0  Difficulty concentrating or making decisions? 0  Walking or climbing stairs? 0  Dressing or bathing? 0  Doing errands, shopping? 0  Preparing Food and eating ? N  Using the Toilet? N  In the past six months, have you accidently leaked urine? N  Do you have problems with loss of bowel control? N  Managing your Medications? N  Managing your Finances? N  Housekeeping or managing your Housekeeping? N    Patient Care Team: Plotnikov, Georgina Quint, MD as PCP - General (Internal Medicine) Illene Labrador, OD as Consulting Physician (Optometry) Hilarie Fredrickson, MD as Consulting Physician (Gastroenterology) Jennette Bill Scherrie Merritts, MD as Consulting Physician (Urology)  Indicate any recent Medical Services you may have received from other than [facility] providers in the  past year (date may be approximate).     Assessment:   This is a routine wellness examination for Jorge Rogers.  Hearing/Vision screen Hearing Screening - Comments:: Denies hearing difficulties   Vision Screening - Comments:: Wears rx glasses - up to date with routine eye exams with Illene Labrador   Goals Addressed               This Visit's Progress     Patient Stated (pt-stated)        Patient stated he plans to stay active.  Depression Screen     02/05/2024    1:17 PM 12/23/2023    8:20 AM 01/26/2023    8:31 AM 01/02/2023    8:52 AM 09/11/2022    9:56 AM 12/26/2021    9:51 AM 12/25/2020    8:59 AM  PHQ 2/9 Scores  PHQ - 2 Score 0 0 0 0 0 0 0  PHQ- 9 Score 0    1      Fall Risk     02/05/2024    1:18 PM 12/23/2023    8:20 AM 01/26/2023    8:31 AM 01/02/2023    8:52 AM 12/31/2022    7:16 PM  Fall Risk   Falls in the past year? 0 0 0 0 0  Number falls in past yr: 0 0 0 0 0  Injury with Fall? 0 0 0 0 0  Risk for fall due to : No Fall Risks No Fall Risks No Fall Risks No Fall Risks   Follow up Falls prevention discussed;Falls evaluation completed Falls evaluation completed Falls evaluation completed Falls prevention discussed     MEDICARE RISK AT HOME:  Medicare Risk at Home Any stairs in or around the home?: No If so, are there any without handrails?: No Home free of loose throw rugs in walkways, pet beds, electrical cords, etc?: Yes Adequate lighting in your home to reduce risk of falls?: Yes Life alert?: No Use of a cane, walker or w/c?: No Grab bars in the bathroom?: No Shower chair or bench in shower?: No Elevated toilet seat or a handicapped toilet?: No  TIMED UP AND GO:  Was the test performed?  No  Cognitive Function: 6CIT completed        02/05/2024    1:19 PM 01/02/2023    8:53 AM  6CIT Screen  What Year? 0 points 0 points  What month? 0 points 0 points  What time? 0 points 0 points  Count back from 20 0 points 0 points  Months in reverse 0 points  0 points  Repeat phrase 0 points 0 points  Total Score 0 points 0 points    Immunizations Immunization History  Administered Date(s) Administered   Fluad Quad(high Dose 65+) 09/08/2022   Influenza-Unspecified 11/01/2020, 10/31/2021   PFIZER(Purple Top)SARS-COV-2 Vaccination 01/22/2020, 02/14/2020   Pfizer Covid-19 Vaccine Bivalent Booster 23yrs & up 01/22/2020, 09/09/2021   Pneumococcal Conjugate-13 03/13/2017   Tdap 12/22/2012   Tetanus 10/20/2014    Screening Tests Health Maintenance  Topic Date Due   Hepatitis C Screening  Never done   Pneumonia Vaccine 92+ Years old (2 of 2 - PPSV23 or PCV20) 05/08/2017   COVID-19 Vaccine (5 - 2024-25 season) 08/02/2023   DTaP/Tdap/Td (3 - Td or Tdap) 10/20/2024   Medicare Annual Wellness (AWV)  02/04/2025   Colonoscopy  01/28/2029   HPV VACCINES  Aged Out   INFLUENZA VACCINE  Discontinued   Zoster Vaccines- Shingrix  Discontinued    Health Maintenance  Health Maintenance Due  Topic Date Due   Hepatitis C Screening  Never done   Pneumonia Vaccine 28+ Years old (2 of 2 - PPSV23 or PCV20) 05/08/2017   COVID-19 Vaccine (5 - 2024-25 season) 08/02/2023   Health Maintenance Items Addressed: 02/05/2024   Additional Screening:  Vision Screening: Recommended annual ophthalmology exams for early detection of glaucoma and other disorders of the eye. Pt stated he plans to schedule his annual eye exam with Illene Labrador - Ophthalmologist for 2025.  Dental Screening: Recommended annual dental  exams for proper oral hygiene  Community Resource Referral / Chronic Care Management: CRR required this visit?  No   CCM required this visit?  No     Plan:     I have personally reviewed and noted the following in the patient's chart:   Medical and social history Use of alcohol, tobacco or illicit drugs  Current medications and supplements including opioid prescriptions. Patient is not currently taking opioid prescriptions. Functional ability  and status Nutritional status Physical activity Advanced directives List of other physicians Hospitalizations, surgeries, and ER visits in previous 12 months Vitals Screenings to include cognitive, depression, and falls Referrals and appointments  In addition, I have reviewed and discussed with patient certain preventive protocols, quality metrics, and best practice recommendations. A written personalized care plan for preventive services as well as general preventive health recommendations were provided to patient.     Darreld Mclean, CMA   02/05/2024   After Visit Summary: (MyChart) Due to this being a telephonic visit, the after visit summary with patients personalized plan was offered to patient via MyChart   Notes: Nothing significant to report at this time.  Medical screening examination/treatment/procedure(s) were performed by non-physician practitioner and as supervising physician I was immediately available for consultation/collaboration.  I agree with above. Jacinta Shoe, MD

## 2024-02-05 NOTE — Patient Instructions (Addendum)
 Jorge Rogers , Thank you for taking time to come for your Medicare Wellness Visit. I appreciate your ongoing commitment to your health goals. Please review the following plan we discussed and let me know if I can assist you in the future.   Referrals/Orders/Follow-Ups/Clinician Recommendations: Aim for 30 minutes of exercise or brisk walking, 6-8 glasses of water, and 5 servings of fruits and vegetables each day. Educated and advised on Tdap, COVID, Hepatitis C, and Pneumonia vaccines.    This is a list of the screening recommended for you and due dates:  Health Maintenance  Topic Date Due   Hepatitis C Screening  Never done   Pneumonia Vaccine (2 of 2 - PPSV23 or PCV20) 05/08/2017   COVID-19 Vaccine (5 - 2024-25 season) 08/02/2023   DTaP/Tdap/Td vaccine (3 - Td or Tdap) 10/20/2024   Medicare Annual Wellness Visit  02/04/2025   Colon Cancer Screening  01/28/2029   HPV Vaccine  Aged Out   Flu Shot  Discontinued   Zoster (Shingles) Vaccine  Discontinued    Advanced directives: (Provided) Advance directive discussed with you today. I have provided a copy for you to complete at home and have notarized. Once this is complete, please bring a copy in to our office so we can scan it into your chart.   Next Medicare Annual Wellness Visit scheduled for next year: Yes - 01/2025

## 2024-06-14 ENCOUNTER — Ambulatory Visit: Payer: Self-pay

## 2024-06-14 NOTE — Telephone Encounter (Signed)
 FYI Only or Action Required?: FYI only for provider.  Patient was last seen in primary care on 12/23/2023 by Plotnikov, [name], MD.  Called Nurse Triage reporting No chief complaint on file..  Symptoms began a week ago.  Interventions attempted: Rest, hydration, or home remedies.  Symptoms are: gradually worsening.  Triage Disposition: See Physician Within 24 Hours  Patient/caregiver understands and will follow disposition?: Yes  Copied from CRM [id number]. Topic: Clinical - Red Word Triage >> Jun 14, 2024  2:30 PM Rosina BIRCH wrote: Reason for RMF:ejupzwu called stating he has been having some acid reflux that has not subsided. It has been going on for ten days  congested and burning sensation in his throat Reason for Disposition  [1] Symptoms of pill stuck in throat or esophagus (e.g., pain in throat or chest, FB sensation) AND [2] no relief after using Care Advice  Answer Assessment - Initial Assessment Questions 1. DESCRIPTION: Tell me more about this problem. Are you  having trouble swallowing liquids, solids, or both? Any trouble with swallowing saliva (spit)?     Swallowing issues  2. SEVERITY: How bad is the swallowing difficulty?  (Scale 1-10; or mild, moderate, severe)     Over the last ten days  3. ONSET: When did the swallowing problems begin?       4. CAUSE: What do you think is causing the problem?  (e.g., dry mouth, food or pill stuck in throat, mouth pain, sore throat, progression of disease process such as dementia or Parkinson's disease).      Unsure, GERD Exacerbation, Meals  5. CHRONIC or RECURRENT: Is this a new problem for you?  If No, ask: How long have you had this problem? (e.g., days, weeks, months)      Chronic  6. OTHER SYMPTOMS: Do you have any other symptoms? (e.g., chest pain, difficulty breathing, mouth sores, sore throat, swollen tongue, chest pain)    Chest Pain  Protocols used: Swallowing Difficulty-A-AH

## 2024-06-16 ENCOUNTER — Encounter: Payer: Self-pay | Admitting: Internal Medicine

## 2024-06-16 ENCOUNTER — Ambulatory Visit (INDEPENDENT_AMBULATORY_CARE_PROVIDER_SITE_OTHER): Admitting: Internal Medicine

## 2024-06-16 VITALS — BP 123/78 | HR 68 | Temp 97.8°F | Ht 70.5 in | Wt 202.0 lb

## 2024-06-16 DIAGNOSIS — K2100 Gastro-esophageal reflux disease with esophagitis, without bleeding: Secondary | ICD-10-CM | POA: Diagnosis not present

## 2024-06-16 DIAGNOSIS — R0789 Other chest pain: Secondary | ICD-10-CM

## 2024-06-16 DIAGNOSIS — K219 Gastro-esophageal reflux disease without esophagitis: Secondary | ICD-10-CM | POA: Insufficient documentation

## 2024-06-16 DIAGNOSIS — R1319 Other dysphagia: Secondary | ICD-10-CM

## 2024-06-16 MED ORDER — PANTOPRAZOLE SODIUM 40 MG PO TBEC: 40.0000 mg | DELAYED_RELEASE_TABLET | Freq: Two times a day (BID) | ORAL | 3 refills | Status: AC

## 2024-06-16 MED ORDER — PANTOPRAZOLE SODIUM 40 MG PO TBEC: 40.0000 mg | DELAYED_RELEASE_TABLET | Freq: Two times a day (BID) | ORAL | 3 refills | Status: DC

## 2024-06-16 MED ORDER — FAMOTIDINE 40 MG PO TABS: 40.0000 mg | ORAL_TABLET | Freq: Every day | ORAL | 1 refills | Status: DC

## 2024-06-16 NOTE — Assessment & Plan Note (Addendum)
 New due to severe GERD, possible stricture Increase Protonix  to 40 mg bid Start Pepcid  40 mg/d GI consult Chest CT, labs if not better in 1 week

## 2024-06-16 NOTE — Progress Notes (Signed)
 Subjective:  Patient ID: Jorge Rogers, male    DOB: January 21, 1951  Age: 73 y.o. MRN: [id number]  CC: Medical Management of Chronic Issues (Pt states he feeling as though his acid reflux has gotten worse x1 week as it is causing him difficulty with swallowing)   HPI Jorge Rogers presents for Medical Management of Chronic Issues (Pt states he feeling as though his acid reflux has gotten worse x1 week as it is causing him difficulty with swallowing, CP. C/o severe GERD. No n/v. No wt loss  Outpatient Medications Prior to Visit  Medication Sig Dispense Refill   aspirin 81 MG tablet Take 81 mg by mouth daily.     Cholecalciferol  (VITAMIN D3) 50 MCG (2000 UT) capsule Take 1 capsule (2,000 Units total) by mouth daily. 100 capsule 3   fluticasone  (FLONASE ) 50 MCG/ACT nasal spray Place 2 sprays into both nostrils daily. 16 g 6   rosuvastatin  (CRESTOR ) 5 MG tablet TAKE 1 TABLET EVERY DAY. ANNUAL APPT DUE IN OCT. MUST SEE MD FOR FUTURE REFILLS. 90 tablet 3   triamcinolone  ointment (KENALOG ) 0.1 % Apply 1 application topically 2 (two) times daily. 80 g 2   pantoprazole  (PROTONIX ) 40 MG tablet TAKE 1 TABLET BY MOUTH EVERY DAY 90 tablet 3   No facility-administered medications prior to visit.    ROS: Review of Systems  Constitutional:  Negative for appetite change, fatigue and unexpected weight change.  HENT:  Negative for congestion, nosebleeds, sneezing, sore throat and trouble swallowing.   Eyes:  Negative for itching and visual disturbance.  Respiratory:  Negative for cough and wheezing.   Cardiovascular:  Negative for chest pain, palpitations and leg swelling.  Gastrointestinal:  Negative for abdominal distention, abdominal pain, blood in stool, diarrhea and nausea.  Genitourinary:  Negative for frequency and hematuria.  Musculoskeletal:  Negative for back pain, gait problem, joint swelling and neck pain.  Skin:  Negative for rash.  Neurological:  Negative for dizziness, tremors, speech  difficulty and weakness.  Psychiatric/Behavioral:  Negative for agitation, dysphoric mood and sleep disturbance. The patient is not nervous/anxious.     Objective:  BP 123/78   Pulse 68   Temp 97.8 F (36.6 C) (Oral)   Ht 5' 10.5 (1.791 m)   Wt 202 lb (91.6 kg)   SpO2 100%   BMI 28.57 kg/m   BP Readings from Last 3 Encounters:  06/16/24 123/78  02/05/24 122/62  12/23/23 110/68    Wt Readings from Last 3 Encounters:  06/16/24 202 lb (91.6 kg)  02/05/24 205 lb 9.6 oz (93.3 kg)  12/23/23 211 lb (95.7 kg)    Physical Exam Constitutional:      General: He is not in acute distress.    Appearance: Normal appearance. He is well-developed.     Comments: NAD  Eyes:     Conjunctiva/sclera: Conjunctivae normal.     Pupils: Pupils are equal, round, and reactive to light.  Neck:     Thyroid : No thyromegaly.     Vascular: No JVD.  Cardiovascular:     Rate and Rhythm: Normal rate and regular rhythm.     Heart sounds: Normal heart sounds. No murmur heard.    No friction rub. No gallop.  Pulmonary:     Effort: Pulmonary effort is normal. No respiratory distress.     Breath sounds: Normal breath sounds. No wheezing or rales.  Chest:     Chest wall: No tenderness.  Abdominal:     General: Bowel sounds  are normal. There is no distension.     Palpations: Abdomen is soft. There is no mass.     Tenderness: There is no abdominal tenderness. There is no guarding or rebound.  Musculoskeletal:        General: No tenderness. Normal range of motion.     Cervical back: Normal range of motion.     Right lower leg: No edema.     Left lower leg: No edema.  Lymphadenopathy:     Cervical: No cervical adenopathy.  Skin:    General: Skin is warm and dry.     Findings: No rash.  Neurological:     Mental Status: He is alert and oriented to person, place, and time.     Cranial Nerves: No cranial nerve deficit.     Motor: No abnormal muscle tone.     Coordination: Coordination normal.      Gait: Gait normal.     Deep Tendon Reflexes: Reflexes are normal and symmetric.  Psychiatric:        Behavior: Behavior normal.        Thought Content: Thought content normal.        Judgment: Judgment normal.   Pt looks well  Lab Results  Component Value Date   WBC 6.9 12/23/2023   HGB 16.0 12/23/2023   HCT 47.8 12/23/2023   PLT 199.0 12/23/2023   GLUCOSE 52 (L) 12/23/2023   CHOL 148 12/23/2023   TRIG 137.0 12/23/2023   HDL 58.00 12/23/2023   LDLCALC 62 12/23/2023   ALT 19 12/23/2023   AST 22 12/23/2023   NA 141 12/23/2023   K 4.3 12/23/2023   CL 104 12/23/2023   CREATININE 1.25 12/23/2023   BUN 11 12/23/2023   CO2 29 12/23/2023   TSH 3.79 12/23/2023   PSA 1.32 09/11/2022   INR 2.2 RATIO (H) 07/15/2007    US  RENAL Result Date: 09/25/2021 CLINICAL DATA:  Renal insufficiency. EXAM: RENAL / URINARY TRACT ULTRASOUND COMPLETE COMPARISON:  None. FINDINGS: Right Kidney: Renal measurements: 9.0 cm x 5.4 cm x 5.4 cm = volume: 135 mL. Echogenicity within normal limits. No mass or hydronephrosis visualized. Left Kidney: Renal measurements: 10.0 cm x 5.6 cm x 5.0 cm = volume: 145 mL. Echogenicity within normal limits. No mass or hydronephrosis visualized. Bladder: Appears normal for degree of bladder distention. Other: None. IMPRESSION: Normal renal ultrasound. Electronically Signed   By: Suzen Dials M.D.   On: 09/25/2021 20:49    Assessment & Plan:   Problem List Items Addressed This Visit     Dysphagia - Primary   Worse Increase Protonix  to 40 mg bid Start Pepcid  40 mg/d GI consult      GERD (gastroesophageal reflux disease)   Worse Increase Protonix  to 40 mg bid Start Pepcid  40 mg/d GI consult      Relevant Medications   famotidine  (PEPCID ) 40 MG tablet   pantoprazole  (PROTONIX ) 40 MG tablet   Chest pain, atypical   New due to severe GERD, possible stricture Increase Protonix  to 40 mg bid Start Pepcid  40 mg/d GI consult Chest CT, labs if not better in 1  week         Meds ordered this encounter  Medications   DISCONTD: pantoprazole  (PROTONIX ) 40 MG tablet    Sig: Take 1 tablet (40 mg total) by mouth 2 (two) times daily.    Dispense:  180 tablet    Refill:  3   famotidine  (PEPCID ) 40 MG tablet    Sig:  Take 1 tablet (40 mg total) by mouth daily.    Dispense:  90 tablet    Refill:  1   pantoprazole  (PROTONIX ) 40 MG tablet    Sig: Take 1 tablet (40 mg total) by mouth 2 (two) times daily.    Dispense:  180 tablet    Refill:  3      Follow-up: Return in about 3 months (around 09/16/2024) for a follow-up visit.  Marolyn Noel, MD

## 2024-06-16 NOTE — Assessment & Plan Note (Signed)
 Worse Increase Protonix  to 40 mg bid Start Pepcid  40 mg/d GI consult

## 2024-08-02 ENCOUNTER — Ambulatory Visit: Payer: Self-pay

## 2024-08-02 ENCOUNTER — Emergency Department (HOSPITAL_COMMUNITY): Admission: EM | Admit: 2024-08-02 | Discharge: 2024-08-02 | Disposition: A | Discharge status: Home / Self Care

## 2024-08-02 ENCOUNTER — Encounter (HOSPITAL_COMMUNITY): Payer: Self-pay | Admitting: Emergency Medicine

## 2024-08-02 DIAGNOSIS — M79662 Pain in left lower leg: Secondary | ICD-10-CM | POA: Diagnosis not present

## 2024-08-02 DIAGNOSIS — R2240 Localized swelling, mass and lump, unspecified lower limb: Secondary | ICD-10-CM | POA: Diagnosis not present

## 2024-08-02 DIAGNOSIS — M7989 Other specified soft tissue disorders: Secondary | ICD-10-CM | POA: Diagnosis not present

## 2024-08-02 DIAGNOSIS — R600 Localized edema: Secondary | ICD-10-CM | POA: Diagnosis not present

## 2024-08-02 DIAGNOSIS — Z7982 Long term (current) use of aspirin: Secondary | ICD-10-CM | POA: Diagnosis not present

## 2024-08-02 LAB — BASIC METABOLIC PANEL WITH GFR
Anion gap: 13 (ref 5–15)
BUN: 14 mg/dL (ref 8–23)
CO2: 22 mmol/L (ref 22–32)
Calcium: 9.7 mg/dL (ref 8.9–10.3)
Chloride: 105 mmol/L (ref 98–111)
Creatinine, Ser: 1.2 mg/dL (ref 0.61–1.24)
GFR, Estimated: >60 mL/min (ref >60)
Glucose, Bld: 74 mg/dL (ref 70–99)
Potassium: 4.5 mmol/L (ref 3.5–5.1)
Sodium: 139 mmol/L (ref 135–145)

## 2024-08-02 LAB — CBC WITH DIFFERENTIAL/PLATELET
Abs Immature Granulocytes: 0.03 K/uL (ref 0.00–0.07)
Basophils Absolute: 0.1 K/uL (ref 0.0–0.1)
Basophils Relative: 1 %
Eosinophils Absolute: 0.4 K/uL (ref 0.0–0.5)
Eosinophils Relative: 4 %
HCT: 41 % (ref 39.0–52.0)
Hemoglobin: 13.9 g/dL (ref 13.0–17.0)
Immature Granulocytes: 0 %
Lymphocytes Relative: 27 %
Lymphs Abs: 2.6 K/uL (ref 0.7–4.0)
MCH: 31.8 pg (ref 26.0–34.0)
MCHC: 33.9 g/dL (ref 30.0–36.0)
MCV: 93.8 fL (ref 80.0–100.0)
Monocytes Absolute: 1.2 K/uL — ABNORMAL HIGH (ref 0.1–1.0)
Monocytes Relative: 13 %
Neutro Abs: 5.3 K/uL (ref 1.7–7.7)
Neutrophils Relative %: 55 %
Platelets: 285 K/uL (ref 150–400)
RBC: 4.37 MIL/uL (ref 4.22–5.81)
RDW: 12.6 % (ref 11.5–15.5)
WBC: 9.6 K/uL (ref 4.0–10.5)
nRBC: 0 % (ref 0.0–0.2)

## 2024-08-02 LAB — PROTIME-INR
INR: 1 (ref 0.8–1.2)
Prothrombin Time: 13.9 s (ref 11.4–15.2)

## 2024-08-02 MED ORDER — ENOXAPARIN SODIUM 100 MG/ML IJ SOSY
90.0000 mg | PREFILLED_SYRINGE | Freq: Once | INTRAMUSCULAR | Status: AC
  Administered 2024-08-02: 90 mg via SUBCUTANEOUS
  Filled 2024-08-02: qty 1

## 2024-08-02 NOTE — Discharge Instructions (Signed)
 Please return tomorrow for ultrasound of your leg to evaluate for blood clot.

## 2024-08-02 NOTE — Telephone Encounter (Signed)
 FYI Only or Action Required?: FYI only for provider.: referred to ED r/o blood clot  Patient was last seen in primary care on 06/16/2024 by Plotnikov, [name], MD.  Called Nurse Triage reporting Leg Swelling.  Symptoms began 1.5 weeks.  Interventions attempted: Nothing.  Symptoms are: gradually worsening.  Triage Disposition: Go to ED Now (or PCP Triage)  Patient/caregiver understands and will follow disposition?: Yes      Copied from CRM #[id number]. Topic: Clinical - Red Word Triage >> Aug 02, 2024 11:19 AM Thersia BROCKS wrote: [facility] that prompted transfer to Nurse Triage: Patient not sure if he was bit but leg is swelling , had a lil fever .    ----------------------------------------------------------------------- From previous Reason for Contact - Scheduling: Patient/patient representative is calling to schedule an appointment. Refer to attachments for appointment information. Reason for Disposition  [1] Swelling is painful to touch AND [2] fever  Answer Assessment - Initial Assessment Questions 1. ONSET: When did the swelling start? (e.g., minutes, hours, days)     1.5 weeks 2. LOCATION: What part of the leg is swollen?  Are both legs swollen or just one leg?     Left leg calf , left ankle area 3. SEVERITY: How bad is the swelling? (e.g., localized; mild, moderate, severe)     Mild to moderate 4. REDNESS: Is there redness or signs of infection?     na 5. PAIN: Is the swelling painful to touch? If Yes, ask: How painful is it?   (Scale 1-10; mild, moderate or severe)     5/10 from calf to thigh 6. FEVER: Do you have a fever? If Yes, ask: What is it, how was it measured, and when did it start?      Little fever 7. CAUSE: What do you think is causing the leg swelling?     unknown 8. MEDICAL HISTORY: Do you have a history of blood clots (e.g., DVT), cancer, heart failure, kidney disease, or liver failure?     na 9. RECURRENT SYMPTOM: Have you had leg  swelling before? If Yes, ask: When was the last time? What happened that time?     na 10. OTHER SYMPTOMS: Do you have any other symptoms? (e.g., chest pain, difficulty breathing)       no 11. PREGNANCY: Is there any chance you are pregnant? When was your last menstrual period?       Na   Pt was attempting to schedule an appt with PCP: nurse recommended pt go to ED to r/o blood clot. Pt stated he would go however he would have preferred to see his PCP.  Protocols used: Leg Swelling and Edema-A-AH

## 2024-08-02 NOTE — ED Triage Notes (Signed)
 Pt arriving POV with left calf pain. Pts provider told him he is concerned pt may have a blood clot. Pt has had left calf pain with swelling for a little over a week.Pt states the pain radiates up to his inner thigh. Pt has been on blood thinners in the past, not taking them currently.

## 2024-08-02 NOTE — ED Provider Notes (Signed)
 [place] EMERGENCY DEPARTMENT AT [facility] Provider Note   CSN: [id number] Arrival date & time: 08/02/24  8078     Patient presents with: Leg Pain (Left calf)   Jorge Rogers is a 73 y.o. male.   This is a 73 year old male presenting emergency department with leg swelling to the left leg x 2 weeks.  Has a history of blood clot, not currently anticoagulated.  No other risk factors for DVT.  Reports his leg feels tight, but no numbness tingling changes in sensation.  He is not having any chest pain or shortness of breath.   Leg Pain      Prior to Admission medications   Medication Sig Start Date End Date Taking? Authorizing Provider  aspirin 81 MG tablet Take 81 mg by mouth daily.    [provider]  Cholecalciferol  (VITAMIN D3) 50 MCG (2000 UT) capsule Take 1 capsule (2,000 Units total) by mouth daily. 09/04/20   Plotnikov, Aleksei V, MD  famotidine  (PEPCID ) 40 MG tablet Take 1 tablet (40 mg total) by mouth daily. 06/16/24   Plotnikov, [name], MD  fluticasone  (FLONASE ) 50 MCG/ACT nasal spray Place 2 sprays into both nostrils daily. 05/11/22   Lavell Bari LABOR, FNP  pantoprazole  (PROTONIX ) 40 MG tablet Take 1 tablet (40 mg total) by mouth 2 (two) times daily. 06/16/24   Plotnikov, Aleksei V, MD  rosuvastatin  (CRESTOR ) 5 MG tablet TAKE 1 TABLET EVERY DAY. ANNUAL APPT DUE IN OCT. MUST SEE MD FOR FUTURE REFILLS. 01/27/24   Plotnikov, Aleksei V, MD  triamcinolone  ointment (KENALOG ) 0.1 % Apply 1 application topically 2 (two) times daily. 12/25/20   Plotnikov, Aleksei V, MD    Allergies: Patient has no known allergies.    Review of Systems  Updated Vital Signs BP (!) 148/90 (BP Location: Left Arm)   Pulse 70   Temp 99.9 F (37.7 C) (Oral)   Resp 16   SpO2 100%   Physical Exam Vitals and nursing note reviewed.  Constitutional:      General: He is not in acute distress.    Appearance: He is not toxic-appearing.  HENT:     Head: Normocephalic.     Nose:  Nose normal.     Mouth/Throat:     Mouth: Mucous membranes are moist.  Eyes:     Conjunctiva/sclera: Conjunctivae normal.  Cardiovascular:     Rate and Rhythm: Normal rate and regular rhythm.     Pulses: Normal pulses.  Pulmonary:     Effort: Pulmonary effort is normal.  Abdominal:     General: Abdomen is flat. There is no distension.     Palpations: Abdomen is soft.     Tenderness: There is no abdominal tenderness. There is no guarding or rebound.  Musculoskeletal:     Left lower leg: Edema present.  Skin:    General: Skin is warm and dry.     Capillary Refill: Capillary refill takes less than 2 seconds.  Neurological:     General: No focal deficit present.     Mental Status: He is alert and oriented to person, place, and time.  Psychiatric:        Mood and Affect: Mood normal.        Behavior: Behavior normal.     (all labs ordered are listed, but only abnormal results are displayed) Labs Reviewed  CBC WITH DIFFERENTIAL/PLATELET - Abnormal; Notable for the following components:      Result Value   Monocytes Absolute 1.2 (*)  All other components within normal limits  BASIC METABOLIC PANEL WITH GFR  PROTIME-INR    EKG: None  Radiology: No results found.   Procedures   Medications Ordered in the ED  enoxaparin  (LOVENOX ) 100 mg/mL injection 1 mg/kg (has no administration in time range)                                    Medical Decision Making This is a 73 year old male presenting emergency department with lower extremity edema and pain x 2 weeks.  He is afebrile nontachycardic, slightly hypertensive.  Physical exam with unilateral leg swelling to left.  Soft compartments.  Strong pulses.  No overlying cellulitis or infectious process.  Screening labs intervally reviewed with no leukocytosis to suggest systemic infection.  No anemia.  Normal kidney function.  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  Do not have  capability for ultrasound currently, will give Lovenox  and outpatient follow-up for ultrasound tomorrow.  Patient agreeable to plan and states he will come back  Amount and/or Complexity of Data Reviewed Independent Historian:     Details: Spouse notes symptoms x 2 weeks. Labs: ordered.  Risk Prescription drug management.       Final diagnoses:  Leg swelling    ED Discharge Orders          Ordered    LE Venous       Comments: IMPORTANT PATIENT INSTRUCTIONS: You have been scheduled for an Outpatient Vascular Study at [facility].  If tomorrow is a Saturday, Sunday or holiday, please go to the [facility] Emergency Department Registration Desk at 11 am tomorrow morning and tell them you are there for a vascular study.  If tomorrow is a weekday (Monday-Friday), please go to the [facility] (address [address] [place]) at 8 am and report to the 4th floor registration Zone A.  Inform registration that you are there for a vascular study.   08/02/24 2045               Neysa Caron PARAS, DO 08/02/24 2045

## 2024-08-03 ENCOUNTER — Other Ambulatory Visit (HOSPITAL_COMMUNITY): Payer: Self-pay

## 2024-08-03 ENCOUNTER — Ambulatory Visit (HOSPITAL_COMMUNITY)
Admission: RE | Admit: 2024-08-03 | Discharge: 2024-08-03 | Disposition: A | Discharge status: Home / Self Care | Source: Ambulatory Visit

## 2024-08-03 ENCOUNTER — Ambulatory Visit: Admitting: Student-PharmD

## 2024-08-03 ENCOUNTER — Telehealth: Payer: Self-pay

## 2024-08-03 ENCOUNTER — Encounter: Payer: Self-pay | Admitting: Student-PharmD

## 2024-08-03 VITALS — BP 144/85 | HR 65 | Wt 202.0 lb

## 2024-08-03 DIAGNOSIS — I82412 Acute embolism and thrombosis of left femoral vein: Secondary | ICD-10-CM

## 2024-08-03 DIAGNOSIS — M7989 Other specified soft tissue disorders: Secondary | ICD-10-CM

## 2024-08-03 MED ORDER — APIXABAN 5 MG PO TABS: 5.0000 mg | ORAL_TABLET | Freq: Two times a day (BID) | ORAL | 5 refills | Status: AC

## 2024-08-03 MED ORDER — APIXABAN (ELIQUIS) VTE STARTER PACK (10MG AND 5MG)
ORAL_TABLET | ORAL | 0 refills | Status: AC
  Filled 2024-08-03: qty 74, 30d supply, fill #0

## 2024-08-03 NOTE — Telephone Encounter (Signed)
 Patient Advocate Encounter  Test billing for this patient's current coverage [facility]) shows a $250 deductible remaining for 2025. Pricing for Eliquis  would be $297 for 30 days, $391 for 90 days. After deductible is met, these copays would reduce to $47 for 30 days, $141 for 90 days.  This test claim was processed through [facility]- copay amounts may vary at other pharmacies due to pharmacy/plan contracts, or as the patient moves through the different stages of their insurance plan.  Rachel DEL, CPhT Rx Patient Advocate Phone: [phone]

## 2024-08-03 NOTE — Patient Instructions (Signed)
-  Start apixaban (Eliquis) 10 mg twice daily for 7 days followed by 5 mg twice daily. -[facility] You may need to call the pharmacy to ask them to fill this when you start to run low on your current supply.  -It is important to take your medication around the same time every day.  -Avoid NSAIDs like ibuprofen (Advil, Motrin) and naproxen (Aleve) as well as aspirin  doses over 100 mg daily. -Tylenol  (acetaminophen ) is the preferred over the counter pain medication to lower the risk of bleeding. -Be sure to alert all of your health care providers that you are taking an anticoagulant prior to starting a new medication or having a procedure. -Monitor for signs and symptoms of bleeding (abnormal bruising, prolonged bleeding, nose bleeds, bleeding from gums, discolored urine, black tarry stools). If you have fallen and hit your head OR if your bleeding is severe or not stopping, seek emergency care.  -Go to the emergency room if emergent signs and symptoms of new clot occur (new or worse swelling and pain in an arm or leg, shortness of breath, chest pain, fast or irregular heartbeats, lightheadedness, dizziness, fainting, coughing up blood) or if you experience a significant color change (pale or blue) in the extremity that has the DVT.  -We recommend you wear compression stockings (20-30 mmHg) as long as you are having swelling or pain. Be sure to purchase the correct size and take them off at night.   If you have any questions or need to reschedule an appointment, please call [phone]. If you are having an emergency, call 911 or present to the nearest emergency room.   What is a DVT?  -Deep vein thrombosis (DVT) is a condition in which a blood clot forms in a vein of the deep venous system which can occur in the lower leg, thigh, pelvis, arm, or neck. This condition is serious and can be life-threatening if the clot travels to the arteries of the lungs and causing a blockage  (pulmonary embolism, PE). A DVT can also damage veins in the leg, which can lead to long-term venous disease, leg pain, swelling, discoloration, and ulcers or sores (post-thrombotic syndrome).  -Treatment may include taking an anticoagulant medication to prevent more clots from forming and the current clot from growing, wearing compression stockings, and/or surgical procedures to remove or dissolve the clot.

## 2024-08-03 NOTE — Progress Notes (Signed)
 DVT Clinic Note  Name: Jorge Rogers     MRN: [id number]     DOB: May 18, 1951     Sex: male  PCP: Garald [name], MD  Today's Visit: Visit Information: Initial Visit  Referred to DVT Clinic by: Emergency Department - Dr. Neysa Referred to CPP by: Dr. Sheree Reason for referral:  Chief Complaint  Patient presents with   DVT   HISTORY OF PRESENT ILLNESS: Jorge Rogers is a 73 y.o. male with PMH RLE DVT and PE, HLD, GERD, who presents after diagnosis of DVT for medication management. Patient presented to the ED yesterday reporting LLE swelling and pain for the prior 2 weeks. He was given a dose of Lovenox  and returned today for ultrasound which showed acute DVT extending from the left femoral vein through the calf veins. He has a history of RLE DVT and PE in 2008, no longer anticoagulated. Per patient, this was provoked by an injury to the leg. Was treated in the EINSTEIN trial and was in the Lovenox  arm. Swelling and tightness in his left leg began behind his knee mid August and he had a single evening of a fever at that time. A week later he drove to [place]  and back. When he returned and the swelling had still not improved with elevation and ibuprofen , he decided to go to the doctor. Reports pain is a 1/10, only tender when he presses on the inside of his thigh. Denies chest pain, SOB. He is very active, plays golf regularly. Denies any recent illness or injury.   Positive Thrombotic Risk Factors: Previous VTE, Older Age Bleeding Risk Factors: Age >65 years  Negative Thrombotic Risk Factors: Recent surgery (within 3 months), Recent trauma (within 3 months), Recent admission to hospital with acute illness (within 3 months), Paralysis, paresis, or recent plaster cast immobilization of lower extremity, Central venous catheterization, Bed rest >72 hours within 3 months, Sedentary journey lasting >8 hours within 4 weeks, Pregnancy, Within 6 weeks postpartum, Recent cesarean section (within 3  months), Estrogen therapy, Testosterone therapy, Erythropoiesis-stimulating agent, Recent COVID diagnosis (within 3 months), Active cancer, Non-malignant, chronic inflammatory condition, Known thrombophilic condition, Smoking, Obesity  Rx Insurance Coverage: Medicare Rx Affordability: Patient has a $250 deductible, so first month supply of Eliquis  will be $297 then copays will be $47/month thereafter, which patient says is affordable. Used one time free card to fill starter pack today. Rx Assistance Provided: Free 30-day trial card Preferred Pharmacy: Starter pack filled at [facility] on site. [facility].   Past Medical History:  Diagnosis Date   Arthritis    knees   Hyperlipidemia    Pulmonary embolism (HCC) 07/02/2007   B PE and RLE DVT    Past Surgical History:  Procedure Laterality Date   ACHILLES TENDON REPAIR     COLONOSCOPY     UPPER GASTROINTESTINAL ENDOSCOPY      Social History   Socioeconomic History   Marital status: Married    Spouse name: Not on file   Number of children: 3   Years of education: Not on file   Highest education level: Some college, no degree  Occupational History   Not on file  Tobacco Use   Smoking status: Never    Passive exposure: Never   Smokeless tobacco: Never  Vaping Use   Vaping status: Never Used  Substance and Sexual Activity   Alcohol use: Yes    Alcohol/week: 1.0 standard drink of alcohol  Types: 1 Shots of liquor per week    Comment: occ - scotch   Drug use: No   Sexual activity: Yes  Other Topics Concern   Not on file  Social History Narrative   Not on file   Social Drivers of Health   Financial Resource Strain: Low Risk  (06/14/2024)   Overall Financial Resource Strain (CARDIA)    Difficulty of Paying Living Expenses: Not very hard  Food Insecurity: No Food Insecurity (06/14/2024)   Hunger Vital Sign    Worried About Running Out of Food in the Last Year: Never true    Ran Out of  Food in the Last Year: Never true  Transportation Needs: No Transportation Needs (06/14/2024)   PRAPARE - Administrator, Civil Service (Medical): No    Lack of Transportation (Non-Medical): No  Physical Activity: Sufficiently Active (06/14/2024)   Exercise Vital Sign    Days of Exercise per Week: 2 days    Minutes of Exercise per Session: 90 min  Stress: No Stress Concern Present (06/14/2024)   [employer] of Occupational Health - Occupational Stress Questionnaire    Feeling of Stress: Not at all  Social Connections: Moderately Integrated (06/14/2024)   Social Connection and Isolation Panel    Frequency of Communication with Friends and Family: Three times a week    Frequency of Social Gatherings with Friends and Family: Three times a week    Attends Religious Services: Patient declined    Active Member of Clubs or Organizations: Yes    Attends Banker Meetings: Patient declined    Marital Status: Married  Catering manager Violence: Not At Risk (02/05/2024)   Humiliation, Afraid, Rape, and Kick questionnaire    Fear of Current or Ex-Partner: No    Emotionally Abused: No    Physically Abused: No    Sexually Abused: No    Family History  Problem Relation Age of Onset   Heart disease Father 54       card arrest   Colon cancer Neg Hx    Esophageal cancer Neg Hx    Rectal cancer Neg Hx    Stomach cancer Neg Hx     Allergies as of 08/03/2024   (No Known Allergies)    Current Outpatient Medications on File Prior to Visit  Medication Sig Dispense Refill   aspirin 81 MG tablet Take 81 mg by mouth daily.     Cholecalciferol  (VITAMIN D3) 50 MCG (2000 UT) capsule Take 1 capsule (2,000 Units total) by mouth daily. 100 capsule 3   famotidine  (PEPCID ) 40 MG tablet Take 1 tablet (40 mg total) by mouth daily. 90 tablet 1   pantoprazole  (PROTONIX ) 40 MG tablet Take 1 tablet (40 mg total) by mouth 2 (two) times daily. 180 tablet 3   rosuvastatin  (CRESTOR ) 5 MG  tablet TAKE 1 TABLET EVERY DAY. ANNUAL APPT DUE IN OCT. MUST SEE MD FOR FUTURE REFILLS. 90 tablet 3   No current facility-administered medications on file prior to visit.   REVIEW OF SYSTEMS:  Review of Systems  Respiratory:  Negative for shortness of breath.   Cardiovascular:  Positive for leg swelling. Negative for chest pain and palpitations.  Musculoskeletal:  Positive for myalgias.  Neurological:  Negative for dizziness and tingling.   PHYSICAL EXAMINATION:  Vitals:   08/03/24 0943  BP: (!) 144/85  Pulse: 65  SpO2: 98%  Weight: 202 lb (91.6 kg)    Body mass index is 28.57 kg/m.  Physical Exam  Vitals reviewed.  Cardiovascular:     Rate and Rhythm: Normal rate.  Pulmonary:     Effort: Pulmonary effort is normal.  Musculoskeletal:        General: Tenderness (mild tenderness on palpation in medial left thigh) present.     Right lower leg: No edema.     Left lower leg: Edema present.  Skin:    Findings: No bruising or erythema.  Psychiatric:        Mood and Affect: Mood normal.        Behavior: Behavior normal.        Thought Content: Thought content normal.   Villalta Score for Post-Thrombotic Syndrome: Pain: Mild Cramps: Absent Heaviness: Absent Paresthesia: Absent Pruritus: Absent Pretibial Edema: Moderate Skin Induration: Absent Hyperpigmentation: Absent Redness: Absent Venous Ectasia: Absent Pain on calf compression: Absent Villalta Preliminary Score: 3 Is venous ulcer present?: No If venous ulcer is present and score is <15, then 15 points total are assigned: Absent Villalta Total Score: 3  LABS:  CBC     Component Value Date/Time   WBC 9.6 08/02/2024 1942   RBC 4.37 08/02/2024 1942   HGB 13.9 08/02/2024 1942   HCT 41.0 08/02/2024 1942   PLT 285 08/02/2024 1942   MCV 93.8 08/02/2024 1942   MCH 31.8 08/02/2024 1942   MCHC 33.9 08/02/2024 1942   RDW 12.6 08/02/2024 1942   LYMPHSABS 2.6 08/02/2024 1942   MONOABS 1.2 (H) 08/02/2024 1942    EOSABS 0.4 08/02/2024 1942   BASOSABS 0.1 08/02/2024 1942    Hepatic Function      Component Value Date/Time   PROT 7.6 12/23/2023 0921   ALBUMIN 4.7 12/23/2023 0921   AST 22 12/23/2023 0921   ALT 19 12/23/2023 0921   ALKPHOS 56 12/23/2023 0921   BILITOT 0.6 12/23/2023 0921   BILIDIR 0.2 03/24/2017 0956    Renal Function   Lab Results  Component Value Date   CREATININE 1.20 08/02/2024   CREATININE 1.25 12/23/2023   CREATININE 1.21 01/26/2023    Estimated Creatinine Clearance: 63 mL/min (by C-G formula based on SCr of 1.2 mg/dL).   [facility] Vascular Lab Studies:  08/03/24 VAS US  LOWER EXTREMITY VENOUS LEFT (DVT)  Summary:  RIGHT:  - No evidence of common femoral vein obstruction.   LEFT:  - No evidence of common femoral vein obstruction.  - Findings consistent with acute deep vein thrombosis involving the left  femoral vein, left popliteal vein, left posterior tibial veins, and left  peroneal veins.   ASSESSMENT: Location of DVT: Left femoral vein, Left popliteal vein, Left distal vein Cause of DVT: unprovoked  Patient with history of provoked RLE DVT and PE in 2008 treated short-term with anticoagulation. Found today to have acute DVT extending from the left femoral vein through the calf veins. No concerns on labs from the ED yesterday. Will start anticoagulation with Eliquis . Likely needs lifelong anticoagulation given this is his second DVT and appears unprovoked, but will defer further workup to hematology. Patient did take a long drive to [place]  but this occurred 1 week after DVT symptom onset. Counseled patient extensively on Eliquis . No barriers to medication access or adherence identified today. Discussed importance of compression stockings to help with swelling.   PLAN: -Start apixaban  (Eliquis ) 10 mg twice daily for 7 days followed by 5 mg twice daily. -Expected duration of therapy: per hematology, likely lifelong. Therapy started on 08/03/24. -Patient educated on  purpose, proper use and potential adverse effects of apixaban  (Eliquis ). -Discussed  importance of taking medication around the same time every day. -Advised patient of medications to avoid (NSAIDs, aspirin doses >100 mg daily). -Educated that Tylenol  (acetaminophen ) is the preferred analgesic to lower the risk of bleeding. -Advised patient to alert all providers of anticoagulation therapy prior to starting a new medication or having a procedure. -Emphasized importance of monitoring for signs and symptoms of bleeding (abnormal bruising, prolonged bleeding, nose bleeds, bleeding from gums, discolored urine, black tarry stools). -Educated patient to present to the ED if emergent signs and symptoms of new thrombosis occur. -Counseled patient to wear compression stockings daily, removing at night. Counseled on proper leg elevation.   Follow up: Referred to hematology. DVT Clinic as needed.  Lum Herald, PharmD, [place], CPP Deep Vein Thrombosis Clinic Clinical Pharmacist Practitioner [phone]

## 2024-08-04 ENCOUNTER — Other Ambulatory Visit: Payer: Self-pay | Admitting: Internal Medicine

## 2024-08-04 DIAGNOSIS — K2100 Gastro-esophageal reflux disease with esophagitis, without bleeding: Secondary | ICD-10-CM

## 2024-08-08 ENCOUNTER — Ambulatory Visit: Admitting: Nurse Practitioner

## 2024-08-11 ENCOUNTER — Inpatient Hospital Stay

## 2024-08-11 VITALS — BP 139/82 | HR 63 | Temp 97.3°F | Resp 20 | Wt 192.9 lb

## 2024-08-11 DIAGNOSIS — I82412 Acute embolism and thrombosis of left femoral vein: Secondary | ICD-10-CM | POA: Diagnosis not present

## 2024-08-11 DIAGNOSIS — I82442 Acute embolism and thrombosis of left tibial vein: Secondary | ICD-10-CM | POA: Insufficient documentation

## 2024-08-11 DIAGNOSIS — I82402 Acute embolism and thrombosis of unspecified deep veins of left lower extremity: Secondary | ICD-10-CM

## 2024-08-11 DIAGNOSIS — Z86711 Personal history of pulmonary embolism: Secondary | ICD-10-CM | POA: Diagnosis not present

## 2024-08-11 DIAGNOSIS — Z7901 Long term (current) use of anticoagulants: Secondary | ICD-10-CM | POA: Diagnosis not present

## 2024-08-11 DIAGNOSIS — I82432 Acute embolism and thrombosis of left popliteal vein: Secondary | ICD-10-CM | POA: Diagnosis not present

## 2024-08-11 DIAGNOSIS — I82452 Acute embolism and thrombosis of left peroneal vein: Secondary | ICD-10-CM | POA: Insufficient documentation

## 2024-08-11 NOTE — Progress Notes (Signed)
 [facility] CONSULT NOTE  Patient Care Team: Plotnikov, [name], MD as PCP - General (Internal Medicine) [name], OD as Consulting Physician (Optometry) [name], MD as Consulting Physician (Gastroenterology) [name], MD as Consulting Physician (Urology)   ASSESSMENT & PLAN:  73 y.o.male with history of CAD, hyperlipidemia, GERD referred to [facility] for history of history of DVT.   First episode: Provoked in 2008 right lower extremity DVT and PE Second episode: Likely provoked 2025 left lower extremity DVT symptoms exacerbated after long car ride Treatment: Apixaban   Discussed anticoagulation for 3 months.  Patient symptoms has resolved.  After that, may consider several options: Apixaban  5 mg twice daily, apixaban  2.5 mg twice daily, aspirin 81 mg once daily, or discontinuation.  We discussed risk of recurrence will be higher in him based on personal history in general population.  Risk of recurrence will be highest in patient completely discontinue anticoagulation.  He was not on aspirin over the past few years with current episode, therefore this can be an option.  Risk of recurrence will be higher on aspirin versus anticoagulation.  We discussed we can obtain thrombophilia testing.  This however will not change management.  Even if negative testing, patient still has increased risk of recurrent thrombosis.  After discussion, he expressed that he preferred not to take any medication.  He is comfortable with taking aspirin 81mg  once daily. Assessment & Plan Acute thromboembolism of deep veins of left lower extremity (HCC) Recommend complete 3 months of full dose anticoagulation After that, patient may consider options as above Apixaban  5 mg twice daily, or apixaban  2.5 mg twice daily, or aspirin 81 mg once daily.   Patient education for risk factors and prevention of clotting We talked about modifiable risk factors.   Prevention of clotting like deep vein thrombosis including: Strong risk factors including fractures of lower limb, hospitalization for severe illness, such as heart failure, myocardial infarction, spinal cord injury, major trauma, hip or knee replacement, and previous VTE. avoid prolonged immobilization and moving extremities every 1-2 hours during long car rides or flights.  Taking a break and moving extremities if working in a job setting with prolonged sitting.   Avoid dehydration, especially in high altitude. Avoid cigarette smoking Maintaining healthy lifestyle to prevent development of diabetes.  Weight loss if BMI over 30.  Regular exercises but not extreme.   Other risk factors for clotting are surgery, hospitalization, inflammatory disease or severe infection, trauma or injuries from inflammatory state and stasis. If developing one-sided leg swelling, pain, color change, chest pain, sudden short of breath, difficulty taking deep breaths, taking deep breath with chest discomfort or pain, dizziness or heart racing sensation, go to the emergency room immediately for evaluation. If developing trauma, uncontrolled bleeding, such as bloody stools report ED immediately. Avoid NSAIDs, aspirin while on blood thinner.  Patient should avoid elective surgery in the acute thrombosis period for 3 months.  Discussed bleeding precautions and avoid high risk activities for falling while on anticoagulation. Anticoagulants do not cause bleeding.  Rather, if bleeding occurs when one is on anticoagulation, it may take longer for the bleeding to stop due to reduce coagulation capacity.  Patient will follow-up as needed.   [name], MD 9/11/20251:35 PM  CHIEF COMPLAINTS/PURPOSE OF CONSULTATION:  Recurrent DVT  HISTORY OF PRESENTING ILLNESS:  Jorge Rogers 73 y.o. male is here because of history of DVT.  Records show the patient report to  ED on 08/02/2024 with left leg pain.  Ultrasound showed left  lower extremity acute DVT involving the left femoral vein, left popliteal vein, left posterior tibial vein and left peroneal veins.  Right lower extremity ultrasound was without DVT.  Report first DVT was at right lower extremity and PE in 2008.  Per patient this was provoked by tennis injury.  He was treated in [place] trial and was in the Lovenox  arm.  For his new episode this year in 2025, report he has single episodes of left leg swelling, pain after waking up from a nap around mid August.  Report he had some tingling and swelling but resolved. Report ibuprofen  helped. Reported she drove to [place]  and back a week later.  He felt a new pain on the thigh. He drove to [facility] for a few days, then came back to [facility]. While he was playing golf, he had persistent pain and called his doctor's office and was told to go to ED.   08/02/24 presented to ED with left leg pain. 08/03/24 ultrasound showed left lower extremity DVT involving the left femoral vein, left popliteal vein, left posterior tibial vein and left peroneal veins.  Right lower extremity ultrasound was without DVT.  He was given Lovenox  and transition to apixaban . He felt back to normal. No leg swelling, and not pain.  He cannot remember last time he took aspirin. He thinks it's longer than a few years since he took aspirin.  Report his son had DVT.  MEDICAL HISTORY:  Past Medical History:  Diagnosis Date   Arthritis    knees   Hyperlipidemia    Pulmonary embolism (HCC) 07/02/2007   B PE and RLE DVT    SURGICAL HISTORY: Past Surgical History:  Procedure Laterality Date   ACHILLES TENDON REPAIR     COLONOSCOPY     UPPER GASTROINTESTINAL ENDOSCOPY      SOCIAL HISTORY: Social History   Socioeconomic History   Marital status: Married    Spouse name: Not on file   Number of children: 3   Years of education: Not on file   Highest education level: Some college, no degree  Occupational History   Not on file  Tobacco Use    Smoking status: Never    Passive exposure: Never   Smokeless tobacco: Never  Vaping Use   Vaping status: Never Used  Substance and Sexual Activity   Alcohol use: Yes    Alcohol/week: 1.0 standard drink of alcohol    Types: 1 Shots of liquor per week    Comment: occ - scotch   Drug use: No   Sexual activity: Yes  Other Topics Concern   Not on file  Social History Narrative   Not on file   Social Drivers of Health   Financial Resource Strain: Low Risk  (06/14/2024)   Overall Financial Resource Strain (CARDIA)    Difficulty of Paying Living Expenses: Not very hard  Food Insecurity: No Food Insecurity (08/11/2024)   Hunger Vital Sign    Worried About Running Out of Food in the Last Year: Never true    Ran Out of Food in the Last Year: Never true  Transportation Needs: No Transportation Needs (08/11/2024)   PRAPARE - Administrator, Civil Service (Medical): No    Lack of Transportation (Non-Medical): No  Physical Activity: Sufficiently Active (06/14/2024)   Exercise Vital Sign    Days of Exercise per Week: 2 days    Minutes of Exercise per Session:  90 min  Stress: No Stress Concern Present (06/14/2024)   [employer] of Occupational Health - Occupational Stress Questionnaire    Feeling of Stress: Not at all  Social Connections: Moderately Integrated (06/14/2024)   Social Connection and Isolation Panel    Frequency of Communication with Friends and Family: Three times a week    Frequency of Social Gatherings with Friends and Family: Three times a week    Attends Religious Services: Patient declined    Active Member of Clubs or Organizations: Yes    Attends Banker Meetings: Patient declined    Marital Status: Married  Catering manager Violence: Not At Risk (08/11/2024)   Humiliation, Afraid, Rape, and Kick questionnaire    Fear of Current or Ex-Partner: No    Emotionally Abused: No    Physically Abused: No    Sexually Abused: No    FAMILY  HISTORY: Family History  Problem Relation Age of Onset   Heart disease Father 9       card arrest   Colon cancer Neg Hx    Esophageal cancer Neg Hx    Rectal cancer Neg Hx    Stomach cancer Neg Hx     ALLERGIES:  has no known allergies.  MEDICATIONS:  Current Outpatient Medications  Medication Sig Dispense Refill   apixaban  (ELIQUIS ) 5 MG TABS tablet Take 1 tablet (5 mg total) by mouth 2 (two) times daily. Start taking after completion of starter pack. 60 tablet 5   APIXABAN  (ELIQUIS ) VTE STARTER PACK (10MG  AND 5MG ) Take as directed on package: start with two-5mg  tablets twice daily for 7 days. On day 8, switch to one-5mg  tablet twice daily. 74 each 0   aspirin 81 MG tablet Take 81 mg by mouth daily.     Cholecalciferol  (VITAMIN D3) 50 MCG (2000 UT) capsule Take 1 capsule (2,000 Units total) by mouth daily. 100 capsule 3   famotidine  (PEPCID ) 40 MG tablet Take 1 tablet (40 mg total) by mouth daily. 90 tablet 1   pantoprazole  (PROTONIX ) 40 MG tablet Take 1 tablet (40 mg total) by mouth 2 (two) times daily. 180 tablet 3   rosuvastatin  (CRESTOR ) 5 MG tablet TAKE 1 TABLET EVERY DAY. ANNUAL APPT DUE IN OCT. MUST SEE MD FOR FUTURE REFILLS. 90 tablet 3   No current facility-administered medications for this visit.    REVIEW OF SYSTEMS:   All relevant systems were reviewed with the patient and are negative.  PHYSICAL EXAMINATION:  Vitals:   08/11/24 1130  BP: 139/82  Pulse: 63  Resp: 20  Temp: (!) 97.3 F (36.3 C)  SpO2: 99%   Filed Weights   08/11/24 1130  Weight: 192 lb 14.4 oz (87.5 kg)    GENERAL: alert, no distress and comfortable SKIN: skin color normal LUNGS: Effort normal and no respiratory distress.  Clear to auscultation HEART: regular rate & rhythm ABDOMEN: abdomen soft, non-tender Lymph: No cervical lymphadenopathy Musculoskeletal: no cyanosis and edema.  Right lower extremity no swelling.  Left lower extremity no swelling  LABORATORY DATA:  I have reviewed  the data as listed   RADIOGRAPHIC STUDIES: I have personally reviewed the radiological images as listed and agreed with the findings in the report. LE Venous Result Date: 08/03/2024  Lower Venous DVT Study Patient Name:  ACXEL DINGEE  Date of Exam:   08/03/2024 Medical Rec #: [id number]     Accession #:    [id number] Date of Birth: February 18, 1951     Patient Gender: CHRISTELLA  Patient Age:   76 years Exam Location:  [address] Procedure:      VAS US  LOWER EXTREMITY VENOUS (DVT) Referring Phys: TRAVIS YOUNG --------------------------------------------------------------------------------  Indications: Swelling. Left lower extremity swelling intermittently for 2 weeks. No chest pain or shortness of breath.  Anticoagulation: Lovenox . Performing Technologist: Edsel Mustard RVT  Examination Guidelines: A complete evaluation includes B-mode imaging, spectral Doppler, color Doppler, and power Doppler as needed of all accessible portions of each vessel. Bilateral testing is considered an integral part of a complete examination. Limited examinations for reoccurring indications may be performed as noted. The reflux portion of the exam is performed with the patient in reverse Trendelenburg.  +-----+---------------+---------+-----------+----------+--------------+ RIGHTCompressibilityPhasicitySpontaneityPropertiesThrombus Aging +-----+---------------+---------+-----------+----------+--------------+ CFV  Full           Yes      Yes                                 +-----+---------------+---------+-----------+----------+--------------+   +---------+---------------+---------+-----------+---------------+--------------+ LEFT     CompressibilityPhasicitySpontaneityProperties     Thrombus Aging +---------+---------------+---------+-----------+---------------+--------------+ CFV      Full           Yes      Yes                                       +---------+---------------+---------+-----------+---------------+--------------+ SFJ      Full           Yes      Yes                                      +---------+---------------+---------+-----------+---------------+--------------+ FV Prox  None           No       No         softly         Acute                                                      echogenic                     +---------+---------------+---------+-----------+---------------+--------------+ FV Mid   None           No       No         softly         Acute                                                      echogenic                     +---------+---------------+---------+-----------+---------------+--------------+ FV DistalNone           No       No         softly         Acute  echogenic                     +---------+---------------+---------+-----------+---------------+--------------+ PFV      Full                                                             +---------+---------------+---------+-----------+---------------+--------------+ POP      None           No       No         softly         Acute                                                      echogenic                     +---------+---------------+---------+-----------+---------------+--------------+ PTV      None           No       No         softly         Acute                                                      echogenic                     +---------+---------------+---------+-----------+---------------+--------------+ PERO     None           No       No         softly         Acute                                                      echogenic                     +---------+---------------+---------+-----------+---------------+--------------+ Gastroc  Full                                                              +---------+---------------+---------+-----------+---------------+--------------+ GSV      Full           Yes      Yes                                      +---------+---------------+---------+-----------+---------------+--------------+    Findings reported to Lum Herald, patient seen at DVT clinic.  Summary: RIGHT: - No evidence of common femoral vein obstruction.   LEFT: - No evidence  of common femoral vein obstruction. - Findings consistent with acute deep vein thrombosis involving the left femoral vein, left popliteal vein, left posterior tibial veins, and left peroneal veins.   *See table(s) above for measurements and observations. Electronically signed by Penne Colorado MD on 08/03/2024 at 4:58:19 PM.    Final

## 2024-08-11 NOTE — Assessment & Plan Note (Addendum)
 Recommend complete 3 months of full dose anticoagulation After that, patient may consider options as above Apixaban  5 mg twice daily, or apixaban  2.5 mg twice daily, or aspirin 81 mg once daily.

## 2024-11-16 ENCOUNTER — Other Ambulatory Visit: Payer: Self-pay | Admitting: Internal Medicine

## 2024-12-06 ENCOUNTER — Other Ambulatory Visit: Payer: Self-pay | Admitting: Internal Medicine

## 2025-02-07 ENCOUNTER — Encounter: Admitting: Internal Medicine

## 2025-02-07 ENCOUNTER — Ambulatory Visit

## 2025-02-14 ENCOUNTER — Encounter: Admitting: Internal Medicine
